# Patient Record
Sex: Female | Born: 1990
Health system: Southern US, Community
[De-identification: ages and names within clinical notes are randomized; demographics above are authoritative.]

## PROBLEM LIST (undated history)

## (undated) DIAGNOSIS — E739 Lactose intolerance, unspecified: Secondary | ICD-10-CM

## (undated) DIAGNOSIS — F329 Major depressive disorder, single episode, unspecified: Secondary | ICD-10-CM

## (undated) DIAGNOSIS — F419 Anxiety disorder, unspecified: Secondary | ICD-10-CM

## (undated) DIAGNOSIS — Z9109 Other allergy status, other than to drugs and biological substances: Secondary | ICD-10-CM

## (undated) DIAGNOSIS — R5383 Other fatigue: Secondary | ICD-10-CM

## (undated) DIAGNOSIS — E559 Vitamin D deficiency, unspecified: Secondary | ICD-10-CM

## (undated) DIAGNOSIS — F32A Depression, unspecified: Secondary | ICD-10-CM

## (undated) DIAGNOSIS — R7303 Prediabetes: Secondary | ICD-10-CM

## (undated) DIAGNOSIS — E282 Polycystic ovarian syndrome: Secondary | ICD-10-CM

## (undated) DIAGNOSIS — G43909 Migraine, unspecified, not intractable, without status migrainosus: Secondary | ICD-10-CM

## (undated) DIAGNOSIS — M549 Dorsalgia, unspecified: Secondary | ICD-10-CM

## (undated) HISTORY — DX: Polycystic ovarian syndrome: E28.2

## (undated) HISTORY — DX: Vitamin D deficiency, unspecified: E55.9

## (undated) HISTORY — PX: APPENDECTOMY: SHX54

## (undated) HISTORY — DX: Migraine, unspecified, not intractable, without status migrainosus: G43.909

## (undated) HISTORY — DX: Other fatigue: R53.83

## (undated) HISTORY — DX: Depression, unspecified: F32.A

## (undated) HISTORY — DX: Prediabetes: R73.03

## (undated) HISTORY — DX: Major depressive disorder, single episode, unspecified: F32.9

## (undated) HISTORY — DX: Anxiety disorder, unspecified: F41.9

## (undated) HISTORY — DX: Dorsalgia, unspecified: M54.9

## (undated) HISTORY — DX: Lactose intolerance, unspecified: E73.9

## (undated) HISTORY — PX: CYSTECTOMY: SUR359

## (undated) HISTORY — DX: Other allergy status, other than to drugs and biological substances: Z91.09

## (undated) HISTORY — PX: TONSILLECTOMY AND ADENOIDECTOMY: SUR1326

---
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
## 2012-01-09 ENCOUNTER — Encounter (HOSPITAL_COMMUNITY): Payer: Self-pay | Admitting: *Deleted

## 2012-01-09 ENCOUNTER — Emergency Department (HOSPITAL_COMMUNITY)
Admission: EM | Admit: 2012-01-09 | Discharge: 2012-01-10 | Disposition: A | Discharge status: Home / Self Care | Payer: BC Managed Care – PPO | Attending: Emergency Medicine | Admitting: Emergency Medicine

## 2012-01-09 DIAGNOSIS — I880 Nonspecific mesenteric lymphadenitis: Secondary | ICD-10-CM | POA: Insufficient documentation

## 2012-01-09 DIAGNOSIS — R1031 Right lower quadrant pain: Secondary | ICD-10-CM | POA: Insufficient documentation

## 2012-01-09 LAB — DIFFERENTIAL
Basophils Absolute: 0.1 10*3/uL (ref 0.0–0.1)
Basophils Relative: 1 % (ref 0–1)
Eosinophils Absolute: 0.1 10*3/uL (ref 0.0–0.7)
Eosinophils Relative: 1 % (ref 0–5)
Lymphocytes Relative: 36 % (ref 12–46)
Monocytes Absolute: 0.6 10*3/uL (ref 0.1–1.0)
Monocytes Relative: 4 % (ref 3–12)
Neutro Abs: 8.2 10*3/uL — ABNORMAL HIGH (ref 1.7–7.7)

## 2012-01-09 LAB — COMPREHENSIVE METABOLIC PANEL
Albumin: 4 g/dL (ref 3.5–5.2)
CO2: 26 mEq/L (ref 19–32)
GFR calc Af Amer: >90 mL/min (ref >90)

## 2012-01-09 LAB — CBC
HCT: 37.1 % (ref 36.0–46.0)
MCV: 67.7 fL — ABNORMAL LOW (ref 78.0–100.0)
Platelets: 410 10*3/uL — ABNORMAL HIGH (ref 150–400)
RBC: 5.48 MIL/uL — ABNORMAL HIGH (ref 3.87–5.11)
WBC: 14.1 10*3/uL — ABNORMAL HIGH (ref 4.0–10.5)

## 2012-01-09 NOTE — ED Notes (Signed)
Pt seen at [facility] for c/o abd pain; middle abd to right side; since Thursday; vomiting and nausea on and off since Thursday; told to come to ER due to elevated WBC count 13.5; denies any urinary symptoms

## 2012-01-10 ENCOUNTER — Emergency Department (HOSPITAL_COMMUNITY): Payer: BC Managed Care – PPO

## 2012-01-10 LAB — URINALYSIS, ROUTINE W REFLEX MICROSCOPIC
Bilirubin Urine: NEGATIVE
Glucose, UA: NEGATIVE mg/dL
Nitrite: NEGATIVE
Protein, ur: NEGATIVE mg/dL
Specific Gravity, Urine: 1.025 (ref 1.005–1.030)
Urobilinogen, UA: 0.2 mg/dL (ref 0.0–1.0)

## 2012-01-10 LAB — PREGNANCY, URINE: Preg Test, Ur: NEGATIVE

## 2012-01-10 MED ORDER — SODIUM CHLORIDE 0.9 % IV SOLN
Freq: Once | INTRAVENOUS | Status: AC
  Administered 2012-01-10: 01:00:00 via INTRAVENOUS

## 2012-01-10 MED ORDER — IBUPROFEN 600 MG PO TABS: 600.0000 mg | ORAL_TABLET | Freq: Four times a day (QID) | ORAL | Status: AC | PRN

## 2012-01-10 MED ORDER — HYDROMORPHONE HCL PF 1 MG/ML IJ SOLN
1.0000 mg | Freq: Once | INTRAMUSCULAR | Status: AC
  Administered 2012-01-10: 1 mg via INTRAVENOUS
  Filled 2012-01-10: qty 1

## 2012-01-10 MED ORDER — ONDANSETRON HCL 4 MG/2ML IJ SOLN
4.0000 mg | Freq: Once | INTRAMUSCULAR | Status: AC
  Administered 2012-01-10: 4 mg via INTRAVENOUS
  Filled 2012-01-10: qty 2

## 2012-01-10 MED ORDER — IOHEXOL 300 MG/ML  SOLN
100.0000 mL | Freq: Once | INTRAMUSCULAR | Status: AC | PRN
  Administered 2012-01-10: 100 mL via INTRAVENOUS

## 2012-01-10 NOTE — ED Provider Notes (Signed)
Medical screening examination/treatment/procedure(s) were performed by non-physician practitioner and as supervising physician I was immediately available for consultation/collaboration.  Beckam Abdulaziz M Suheily Birks, MD 01/10/12 0555 

## 2012-01-10 NOTE — Discharge Instructions (Signed)
Swollen Lymph Nodes The lymphatic system filters fluid from around cells. It is like a system of blood vessels. These channels carry lymph instead of blood. The lymphatic system is an important part of the immune (disease fighting) system. When people talk about "swollen glands in the neck," they are usually talking about swollen lymph nodes. The lymph nodes are like the little traps for infection. You and your caregiver may be able to feel lymph nodes, especially swollen nodes, in these common areas: the groin (inguinal area), armpits (axilla), and above the clavicle (supraclavicular). You may also feel them in the neck (cervical) and the back of the head just above the hairline (occipital). Swollen glands occur when there is any condition in which the body responds with an allergic type of reaction. For instance, the glands in the neck can become swollen from insect bites or any type of minor infection on the head. These are very noticeable in children with only minor problems. Lymph nodes may also become swollen when there is a tumor or problem with the lymphatic system, such as Hodgkin's disease. TREATMENT   Most swollen glands do not require treatment. They can be observed (watched) for a short period of time, if your caregiver feels it is necessary. Most of the time, observation is not necessary.   Antibiotics (medicines that kill germs) may be prescribed by your caregiver. Your caregiver may prescribe these if he or she feels the swollen glands are due to a bacterial (germ) infection. Antibiotics are not used if the swollen glands are caused by a virus.  HOME CARE INSTRUCTIONS   Take medications as directed by your caregiver. Only take over-the-counter or prescription medicines for pain, discomfort, or fever as directed by your caregiver.  SEEK MEDICAL CARE IF:   If you begin to run a temperature greater than 102 F (38.9 C), or as your caregiver suggests.  MAKE SURE YOU:   Understand these  instructions.   Will watch your condition.   Will get help right away if you are not doing well or get worse.  Document Released: 07/15/2002 Document Revised: 07/14/2011 Document Reviewed: 07/25/2005 [facility] Patient Information 2012 [place], [place].Your CT Scan shows only enlarged lymph nodes in your mesentery no infection Appendix is normal

## 2012-01-10 NOTE — ED Provider Notes (Signed)
History     CSN: [id number]  Arrival date & time 01/09/12  2032   First MD Initiated Contact with Patient 01/09/12 2359      Chief Complaint  Patient presents with  . Abdominal Pain    (Consider location/radiation/quality/duration/timing/severity/associated sxs/prior treatment) HPI Comments: Patient states that on Thursday, 5 days ago.  She noticed some vague discomfort in her abdomen she attributed this to onset of her menses.  She had one episode of vomiting.  She went down and rested.  Friday.  She felt vaguely uncomfortable still nauseated.  This again was repeated on Saturday, but by Sunday.  She had more to localized, right lower quadrant pain, anorexia, continued, nausea.  She went to urgent care today had lab work, which revealed a white count of 13.5.  No urinary tract infection, and was sent for further evaluation of the right lower cord and pain.  Patient is a 21 y.o. female presenting with abdominal pain. The history is provided by the patient.  Abdominal Pain The primary symptoms of the illness include abdominal pain and nausea. The primary symptoms of the illness do not include fever, fatigue, shortness of breath, vomiting, diarrhea, dysuria or vaginal discharge. The current episode started more than 2 days ago. The onset of the illness was gradual. The problem has been gradually worsening.  The abdominal pain began more than 2 days ago. The pain came on gradually. The abdominal pain has been gradually worsening since its onset. The abdominal pain is located in the RUQ. The abdominal pain does not radiate. The severity of the abdominal pain is 6/10. The abdominal pain is relieved by nothing. The abdominal pain is exacerbated by certain positions.  Nausea began 6 to 7 days ago.  The patient states that she believes she is currently not pregnant. The patient has not had a change in bowel habit. Additional symptoms associated with the illness include anorexia. Symptoms associated with  the illness do not include urgency, frequency or back pain.    History reviewed. No pertinent past medical history.  History reviewed. No pertinent past surgical history.  No family history on file.  History  Substance Use Topics  . Smoking status: Never Smoker   . Smokeless tobacco: Not on file  . Alcohol Use: No    OB History    Grav Para Term Preterm Abortions TAB SAB Ect Mult Living                  Review of Systems  Constitutional: Negative for fever and fatigue.  Respiratory: Negative for shortness of breath.   Gastrointestinal: Positive for nausea, abdominal pain and anorexia. Negative for vomiting and diarrhea.  Genitourinary: Negative for dysuria, urgency, frequency and vaginal discharge.  Musculoskeletal: Negative for back pain.  Neurological: Negative for weakness.    Allergies  Codeine and Zinc oxide  Home Medications   Current Outpatient Rx  Name Route Sig Dispense Refill  . IBUPROFEN 200 MG PO TABS Oral Take 200 mg by mouth every 6 (six) hours as needed. Pain    . IBUPROFEN 600 MG PO TABS Oral Take 1 tablet (600 mg total) by mouth every 6 (six) hours as needed for pain. 30 tablet 0    BP 110/68  Pulse 78  Temp(Src) 97.8 F (36.6 C) (Oral)  Resp 20  Ht 5\' 7"  (1.702 m)  Wt 215 lb (97.523 kg)  BMI 33.67 kg/m2  SpO2 96%  LMP 01/05/2012  Physical Exam  Constitutional: She appears well-developed and  well-nourished.  HENT:  Head: Normocephalic.  Eyes: Pupils are equal, round, and reactive to light.  Neck: Normal range of motion.  Cardiovascular: Normal rate.   Pulmonary/Chest: Effort normal.  Abdominal: She exhibits no distension. There is no hepatomegaly. There is tenderness in the right lower quadrant.      ED Course  Procedures (including critical care time)  Labs Reviewed  CBC - Abnormal; Notable for the following:    WBC 14.1 (*)    RBC 5.48 (*)    Hemoglobin 11.3 (*)    MCV 67.7 (*)    MCH 20.6 (*)    RDW 17.9 (*)    Platelets  410 (*)    All other components within normal limits  DIFFERENTIAL - Abnormal; Notable for the following:    Neutro Abs 8.2 (*)    Lymphs Abs 5.1 (*)    All other components within normal limits  COMPREHENSIVE METABOLIC PANEL - Abnormal; Notable for the following:    Total Bilirubin 0.2 (*)    All other components within normal limits  URINALYSIS, ROUTINE W REFLEX MICROSCOPIC  PREGNANCY, URINE   Ct Abdomen Pelvis W Contrast  01/10/2012  *RADIOLOGY REPORT*  Clinical Data: Right lower quadrant abdominal pain.  CT ABDOMEN AND PELVIS WITH CONTRAST  Technique:  Multidetector CT imaging of the abdomen and pelvis was performed following the standard protocol during bolus administration of intravenous contrast.  Contrast: OMNIPAQUE IOHEXOL 300 MG/ML  SOLN  Comparison: CT scan 06/25/2008.  Findings: The lung bases are clear.  No pleural effusion or pulmonary nodule.  There is diffuse and fairly marked fatty infiltration of the liver with an elongated left lobe surrounding the spleen.  Areas of focal fatty sparing are noted around the gallbladder.  No hepatic lesions or intrahepatic ductal dilatation.  The gallbladder is normal.  No common bile duct dilatation.  The pancreas is normal.  The spleen is normal in size.  No focal lesions.  The adrenal glands and kidneys are normal.  The stomach, duodenum, small bowel and colon are unremarkable.  No inflammatory changes or mass lesions.  The appendix is normal.  The uterus and ovaries are normal.  The bladder is normal.  No pelvic mass or adenopathy.  No free pelvic fluid collections.  The aorta is normal in caliber.  The major branch vessels are normal.  No mesenteric or retroperitoneal masses are lymphadenopathy.  There are scattered mesenteric lymph nodes.  The bony structures are unremarkable.  IMPRESSION: Unremarkable CT abdomen/pelvis.  Original Report Authenticated By: P. Loralie Champagne, M.D.     1. Lymphadenitis, mesenteric, acute       MDM    Exam and history concerning for acute appendicitis        Arman Filter, NP 01/10/12 0016  Arman Filter, NP 01/10/12 [phone]

## 2012-01-10 NOTE — ED Notes (Signed)
Pt plus family member was given a warm blanket

## 2012-01-10 NOTE — ED Notes (Signed)
Patient transported to CT 

## 2013-12-29 IMAGING — CT CT ABD-PELV W/ CM
1 of 2 series · 15 of 32 positions shown, 19 images · IV contrast (100 ML OMNI 300)
Comparison: CT scan 06/25/2008.

CLINICAL DATA: Right lower quadrant abdominal pain.

CT ABDOMEN AND PELVIS WITH CONTRAST
TECHNIQUE: Multidetector CT imaging of the abdomen and pelvis was
performed following the standard protocol during bolus
administration of intravenous contrast.
Contrast: 100mL OMNIPAQUE IOHEXOL 300 MG/ML  SOLN

[Series 2: abd/pel with · axial · 0.76mm/px · z∈[-406,+8]mm · 15 of 91 slices shown, 19 images]
[im 4/91  soft-tissue]
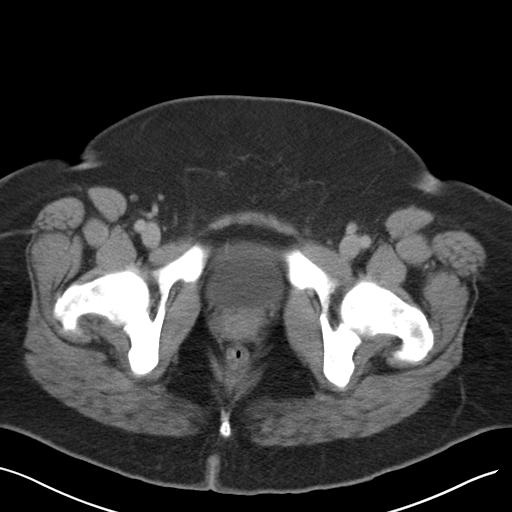
[im 4/91  bone]
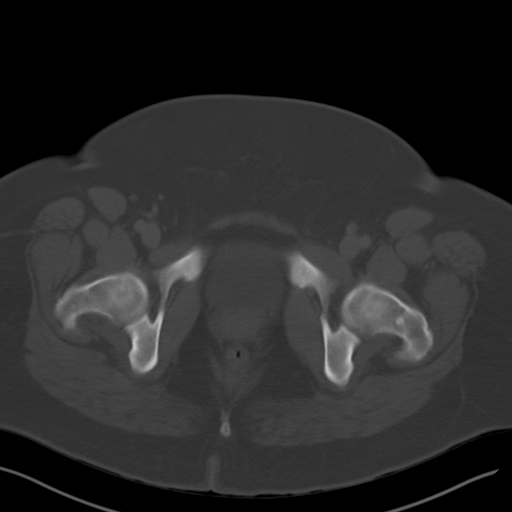
[im 12/91  soft-tissue]
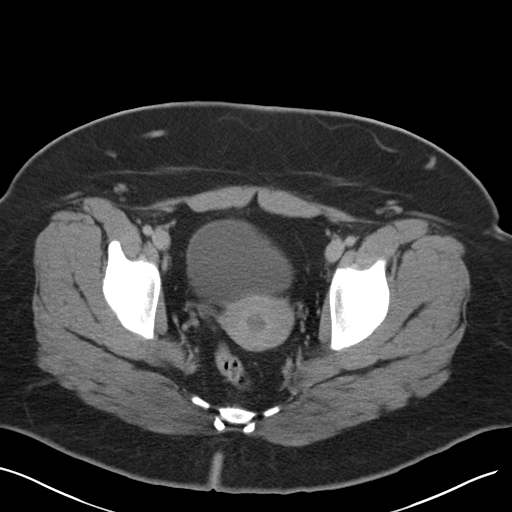
[im 19/91  soft-tissue]
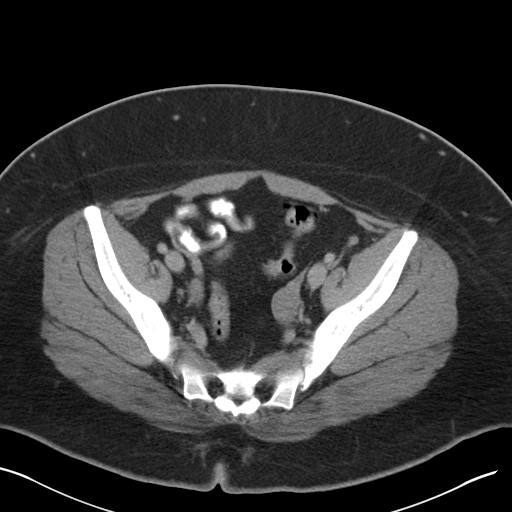
[im 27/91  soft-tissue]
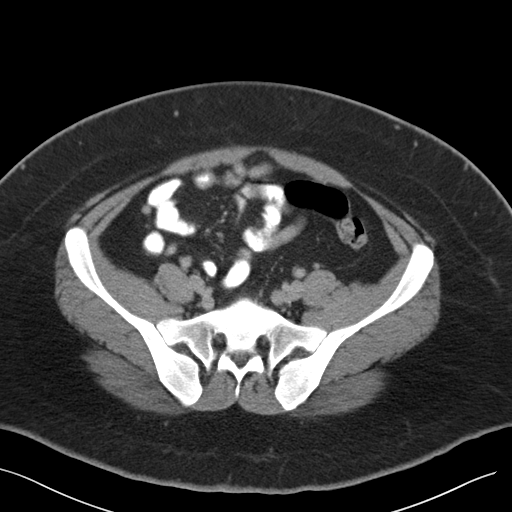
[im 31/91  soft-tissue]
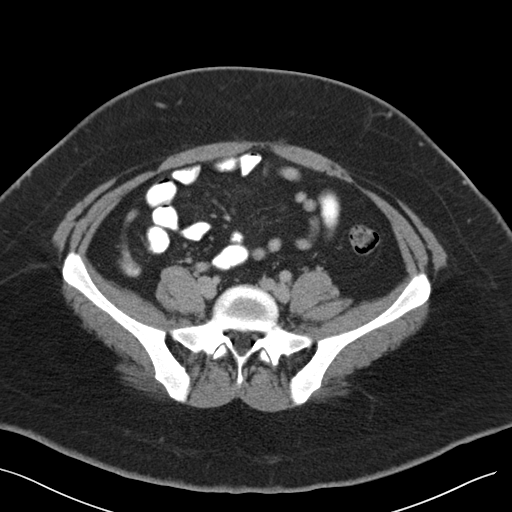
[im 38/91  soft-tissue]
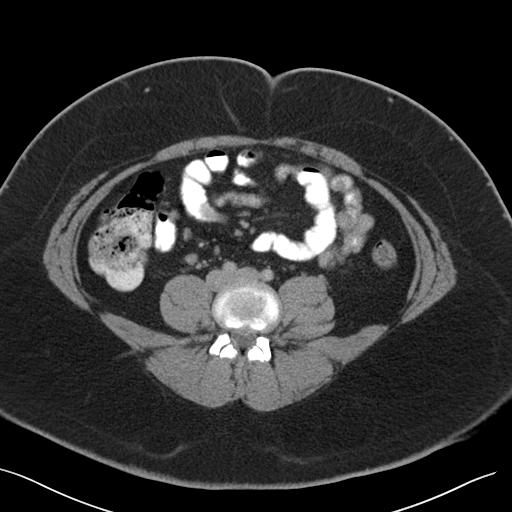
[im 46/91  soft-tissue]
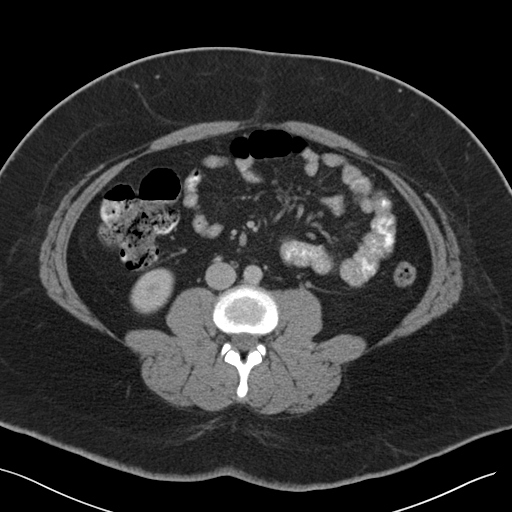
[im 53/91  soft-tissue]
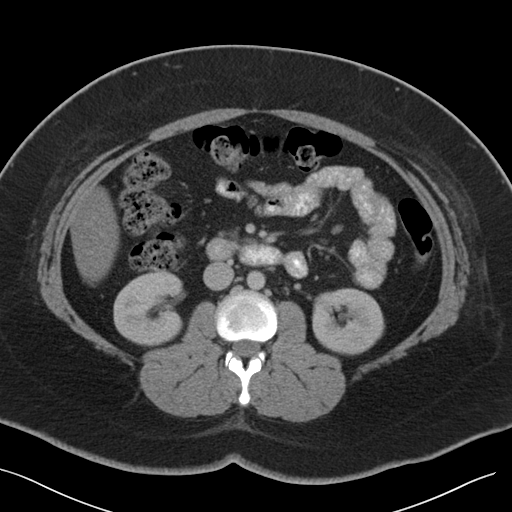
[im 61/91  soft-tissue]
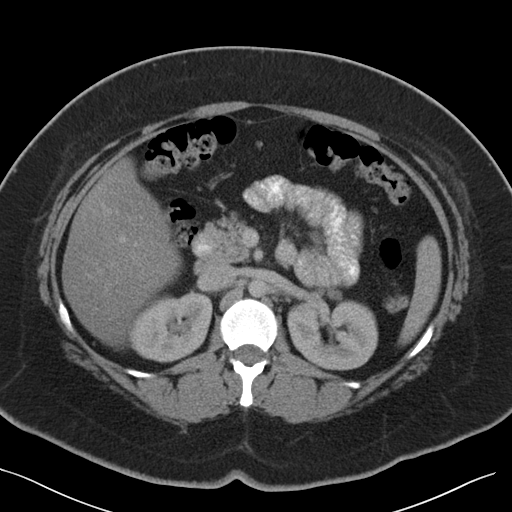
[im 61/91  bone]
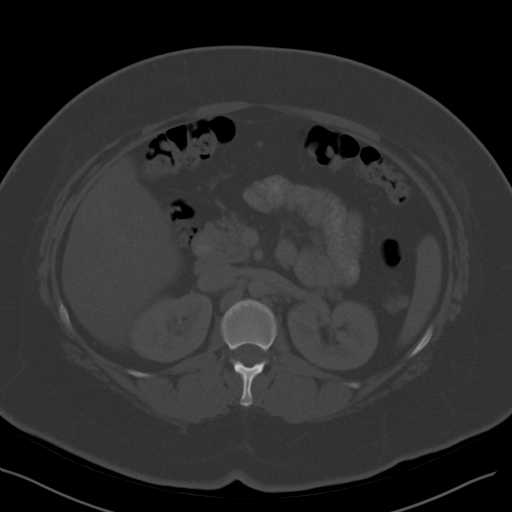
[im 64/91  soft-tissue]
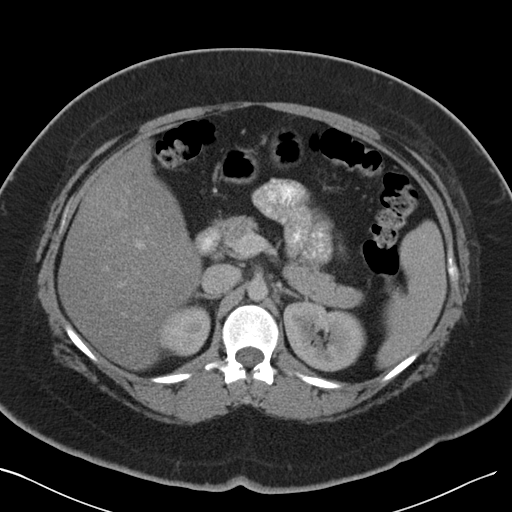
[im 72/91  soft-tissue]
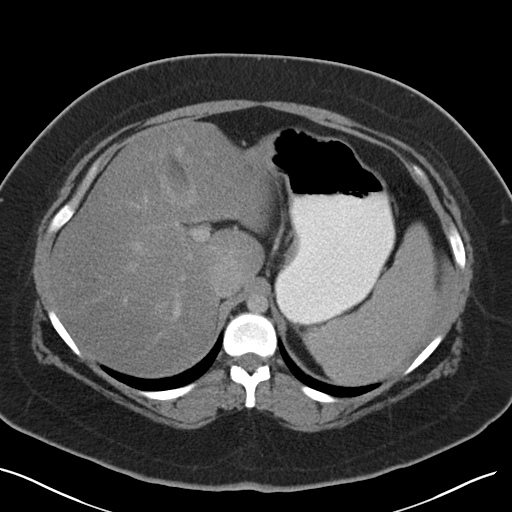
[im 76/91  lung]
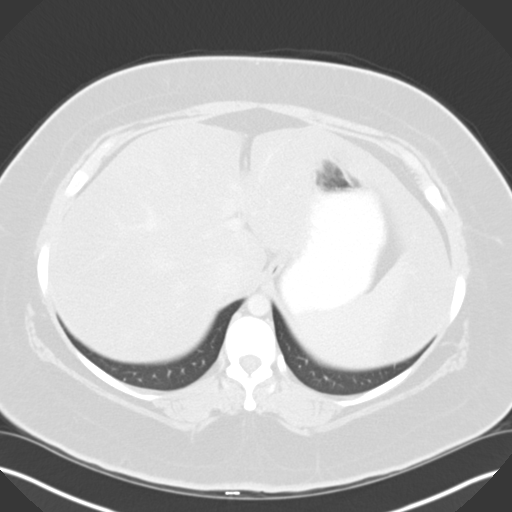
[im 79/91  soft-tissue]
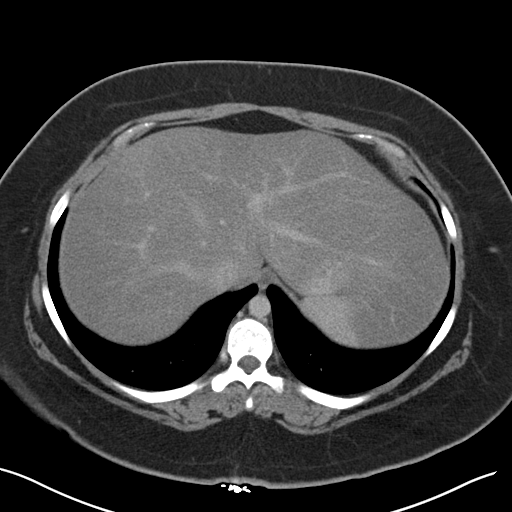
[im 79/91  lung]
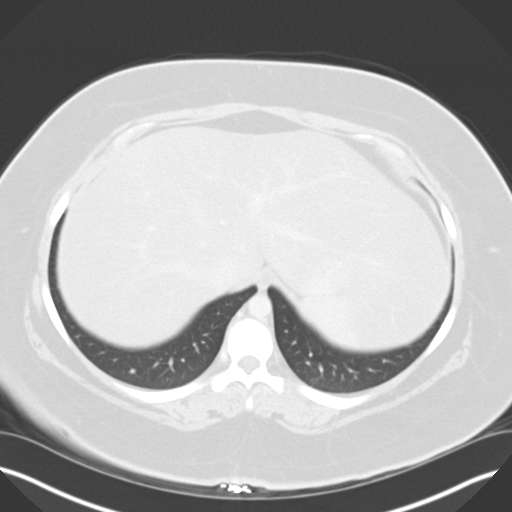
[im 83/91  lung]
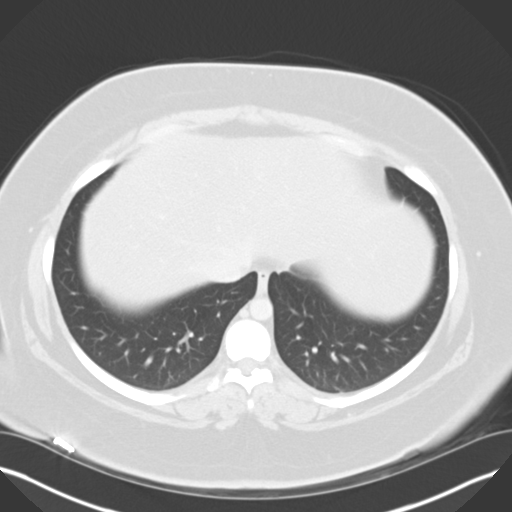
[im 87/91  soft-tissue]
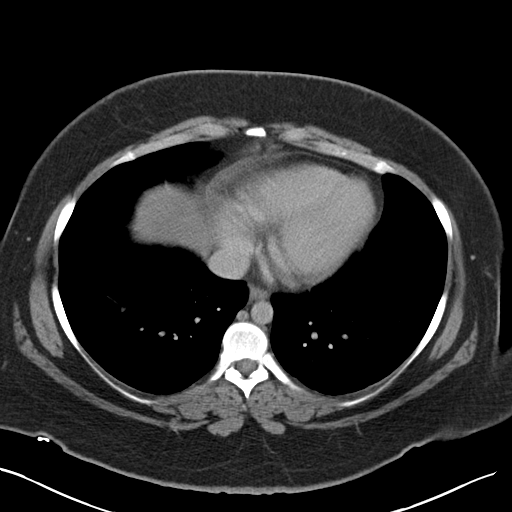
[im 87/91  lung]
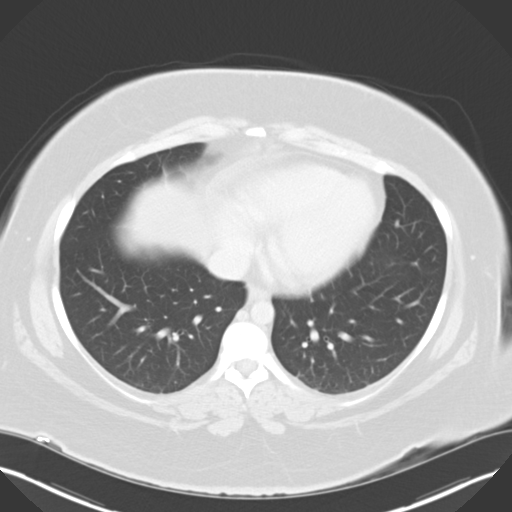

[15 of 32 positions shown; findings below may reference images not displayed]

FINDINGS: The lung bases are clear.  No pleural effusion or
pulmonary nodule.

There is diffuse and fairly marked fatty infiltration of the liver
with an elongated left lobe surrounding the spleen.  Areas of focal
fatty sparing are noted around the gallbladder.  No hepatic lesions
or intrahepatic ductal dilatation.  The gallbladder is normal.  No
common bile duct dilatation.  The pancreas is normal.  The spleen
is normal in size.  No focal lesions.  The adrenal glands and
kidneys are normal.

The stomach, duodenum, small bowel and colon are unremarkable.  No
inflammatory changes or mass lesions.  The appendix is normal.

The uterus and ovaries are normal.  The bladder is normal.  No
pelvic mass or adenopathy.  No free pelvic fluid collections.

The aorta is normal in caliber.  The major branch vessels are
normal.  No mesenteric or retroperitoneal masses are
lymphadenopathy.  There are scattered mesenteric lymph nodes.

The bony structures are unremarkable.
IMPRESSION: Unremarkable CT abdomen/pelvis.

## 2018-07-21 ENCOUNTER — Telehealth: Payer: Self-pay | Admitting: Nurse Practitioner

## 2018-07-21 DIAGNOSIS — R05 Cough: Secondary | ICD-10-CM

## 2018-07-21 DIAGNOSIS — R059 Cough, unspecified: Secondary | ICD-10-CM

## 2018-07-21 MED ORDER — BENZONATATE 100 MG PO CAPS: 100.0000 mg | ORAL_CAPSULE | Freq: Three times a day (TID) | ORAL | 0 refills | Status: DC | PRN

## 2018-07-21 NOTE — Progress Notes (Signed)
We are sorry that you are not feeling well.  Here is how we plan to help!  Based on your presentation I believe you most likely have A cough due to a virus.  This is called viral bronchitis and is best treated by rest, plenty of fluids and control of the cough. This is not the flu. You may use Ibuprofen or Tylenol as directed to help your symptoms.     In addition you may use A prescription cough medication called Tessalon Perles 100mg . You may take 1-2 capsules every 8 hours as needed for your cough.    From your responses in the eVisit questionnaire you describe inflammation in the upper respiratory tract which is causing a significant cough.  This is commonly called Bronchitis and has four common causes:    Allergies  Viral Infections  Acid Reflux  Bacterial Infection Allergies, viruses and acid reflux are treated by controlling symptoms or eliminating the cause. An example might be a cough caused by taking certain blood pressure medications. You stop the cough by changing the medication. Another example might be a cough caused by acid reflux. Controlling the reflux helps control the cough.  USE OF BRONCHODILATOR ("RESCUE") INHALERS: There is a risk from using your bronchodilator too frequently.  The risk is that over-reliance on a medication which only relaxes the muscles surrounding the breathing tubes can reduce the effectiveness of medications prescribed to reduce swelling and congestion of the tubes themselves.  Although you feel brief relief from the bronchodilator inhaler, your asthma may actually be worsening with the tubes becoming more swollen and filled with mucus.  This can delay other crucial treatments, such as oral steroid medications. If you need to use a bronchodilator inhaler daily, several times per day, you should discuss this with your provider.  There are probably better treatments that could be used to keep your asthma under control.     HOME CARE . Only take  medications as instructed by your medical team. . Complete the entire course of an antibiotic. . Drink plenty of fluids and get plenty of rest. . Avoid close contacts especially the very young and the elderly . Cover your mouth if you cough or cough into your sleeve. . Always remember to wash your hands . A steam or ultrasonic humidifier can help congestion.   GET HELP RIGHT AWAY IF: . You develop worsening fever. . You become short of breath . You cough up blood. . Your symptoms persist after you have completed your treatment plan MAKE SURE YOU   Understand these instructions.  Will watch your condition.  Will get help right away if you are not doing well or get worse.  Your e-visit answers were reviewed by a board certified advanced clinical practitioner to complete your personal care plan.  Depending on the condition, your plan could have included both over the counter or prescription medications. If there is a problem please reply  once you have received a response from your provider. Your safety is important to us.  If you have drug allergies check your prescription carefully.    You can use MyChart to ask questions about today's visit, request a non-urgent call back, or ask for a work or school excuse for 24 hours related to this e-Visit. If it has been greater than 24 hours you will need to follow up with your provider, or enter a new e-Visit to address those concerns. You will get an e-mail in the next two days asking  about your experience.  I hope that your e-visit has been valuable and will speed your recovery. Thank you for using e-visits.

## 2018-07-31 ENCOUNTER — Telehealth: Payer: Self-pay | Admitting: Nurse Practitioner

## 2018-07-31 DIAGNOSIS — B9789 Other viral agents as the cause of diseases classified elsewhere: Secondary | ICD-10-CM

## 2018-07-31 DIAGNOSIS — J329 Chronic sinusitis, unspecified: Secondary | ICD-10-CM

## 2018-07-31 MED ORDER — AMOXICILLIN-POT CLAVULANATE 875-125 MG PO TABS: 1.0000 | ORAL_TABLET | Freq: Two times a day (BID) | ORAL | 0 refills | Status: DC

## 2018-07-31 MED ORDER — FLUTICASONE PROPIONATE 50 MCG/ACT NA SUSP: 2.0000 | Freq: Every day | NASAL | 6 refills | Status: DC

## 2018-07-31 NOTE — Progress Notes (Signed)
Patient replied back and had actually been sick for >10 days. Did an evisit last week and was not given antibiotic. Was dx with viral cough. Decided to treat since had been greater then 10 days and was worsening.  Meds ordered this encounter  Medications  . fluticasone (FLONASE) 50 MCG/ACT nasal spray    Sig: Place 2 sprays into both nostrils daily.    Dispense:  16 g    Refill:  6    Order Specific Question:   Supervising Provider    Answer:   MILLER, BRIAN [[id number]]  . amoxicillin-clavulanate (AUGMENTIN) 875-125 MG tablet    Sig: Take 1 tablet by mouth 2 (two) times daily.    Dispense:  14 tablet    Refill:  0    Order Specific Question:   Supervising Provider    Answer:   Eber HongMILLER, BRIAN [[id number]]

## 2018-07-31 NOTE — Addendum Note (Signed)
Addended by: Bennie PieriniMARTIN, MARY-MARGARET on: 07/31/2018 07:30 PM   Modules accepted: Orders

## 2018-07-31 NOTE — Progress Notes (Signed)

## 2018-09-04 ENCOUNTER — Telehealth: Payer: Self-pay | Admitting: Physician Assistant

## 2018-09-04 DIAGNOSIS — R69 Illness, unspecified: Secondary | ICD-10-CM

## 2018-09-04 DIAGNOSIS — J111 Influenza due to unidentified influenza virus with other respiratory manifestations: Secondary | ICD-10-CM

## 2018-09-04 MED ORDER — OSELTAMIVIR PHOSPHATE 75 MG PO CAPS: 75.0000 mg | ORAL_CAPSULE | Freq: Two times a day (BID) | ORAL | 0 refills | Status: DC

## 2018-09-04 NOTE — Progress Notes (Signed)
E visit for Flu like symptoms   We are sorry that you are not feeling well.  Here is how we plan to help! Based on what you have shared with me it looks like you may have a respiratory virus that may be influenza.  Influenza or "the flu" is   an infection caused by a respiratory virus. The flu virus is highly contagious and persons who did not receive their yearly flu vaccination may "catch" the flu from close contact.  We have anti-viral medications to treat the viruses that cause this infection. They are not a "cure" and only shorten the course of the infection. These prescriptions are most effective when they are given within the first 2 days of "flu" symptoms. Antiviral medication are indicated if you have a high risk of complications from the flu. You should also consider an antiviral medication if you are in close contact with someone who is at risk. These medications can help patients avoid complications from the flu  but have side effects that you should know. Possible side effects from Tamiflu or oseltamivir include nausea, vomiting, diarrhea, dizziness, headaches, eye redness, sleep problems or other respiratory symptoms. You should not take Tamiflu if you have an allergy to oseltamivir or any to the ingredients in Tamiflu.  Based upon your symptoms and potential risk factors I have prescribed Oseltamivir (Tamiflu).  It has been sent to your designated pharmacy.  You will take one 75 mg capsule orally twice a day for the next 5 days.  ANYONE WHO HAS FLU SYMPTOMS SHOULD: Stay home. The flu is highly contagious and going out or to work exposes others!  Be sure to drink plenty of fluids. Water is fine as well as fruit juices, sodas and electrolyte beverages. You may want to stay away from caffeine or alcohol. If you are nauseated, try taking small sips of liquids. How do you know if you are getting enough fluid? Your urine should be a pale yellow or almost colorless. Get rest. Taking a steamy  shower or using a humidifier may help nasal congestion and ease sore throat pain. Using a saline nasal spray works much the same way. Cough drops, hard candies and sore throat lozenges may ease your cough. Line up a caregiver. Have someone check on you regularly.   GET HELP RIGHT AWAY IF: You cannot keep down liquids or your medications. You become short of breath Your fell like you are going to pass out or loose consciousness. Your symptoms persist after you have completed your treatment plan MAKE SURE YOU  Understand these instructions. Will watch your condition. Will get help right away if you are not doing well or get worse.  Your e-visit answers were reviewed by a board certified advanced clinical practitioner to complete your personal care plan.  Depending on the condition, your plan could have included both over the counter or prescription medications.  If there is a problem please reply once you have received a response from your provider.  Your safety is important to [name].  If you have drug allergies check your prescription carefully.    You can use MyChart to ask questions about today's visit, request a non-urgent call back, or ask for a work or school excuse for 24 hours related to this e-Visit. If it has been greater than 24 hours you will need to follow up with your provider, or enter a new e-Visit to address those concerns.  You will get an e-mail in the next two  days asking about your experience.  I hope that your e-visit has been valuable and will speed your recovery. Thank you for using e-visits.   ===View-only below this line===   ----- Message -----    From: Drucilla Schmidt    Sent: 09/04/2018 10:09 AM EST      To: E-Visit Mailing List Subject: E-Visit Submission: Flu Like Symptoms  E-Visit Submission: Flu Like Symptoms --------------------------------  Question: How long have you had flu like symptoms? Answer:   less than 48 hours  Question: Are you having  any body aches or pain such as Answer:   Muscle pain            Back pain  Question: Do you have a cough or sore throat? Answer:   I have both a cough and a sore throat  Question: Are you in close contact with anyone who has similar symptoms ? Answer:   Yes  Question: Do you have a fever? Answer:   I don't know  Question: Describe your sore throat: Answer:   Hurts to swallow. Very dry cough  Question: How long have you had a sore throat? Answer:   1 day  Question: Do you have any tenderness or swelling in your neck? Answer:   No  Question: Are you short of breath? Answer:   No  Question: Do you have constant pain in your chest or abdomen? Answer:   No  Question: Are you able to keep liquids down? Answer:   Yes  Question: Have you experienced a seizure or loss of consciousness? Answer:   No  Question: Do you have a headache? Answer:   Yes  Question: Is there a rash? Answer:   No  Question: Are you over the age of 59? Answer:   No  Question: Are you treated for any of the following conditions: Asthma, COPD, diabetes, renal failure on dialysis, AIDS, any neuromuscular disease that effects the clearing of secretions heart failure or heart disease? Answer:   No  Question: Have you received recent chemotherapy or are you taking a drug that may reduce your ability to fight infections for psoriasis, arthritis, or any other autoimmune condition? Answer:   No  Question: Do you have close contact with anyone who has been diagnosed with any of the conditions listed above? Answer:   No  Question: Are you pregnant? Answer:   I am confident that I am not pregnant  Question: Are you breastfeeding? Answer:   No  Question: Are your symptoms severe enough that you think or someone has told you that you need to see a physician urgently? Answer:   No  Question: Are you allergic to Zanamivir or Oseltamivir (Tamiflu)? Answer:   No  Question: Are there children in your family  or household that are under the age of 5? Answer:   No  Question: Please list your medication allergies that you may have ? (If 'none' , please list as 'none') Answer:   Zincoxidie  Question: Please list any additional comments  Answer:

## 2018-09-07 DIAGNOSIS — J309 Allergic rhinitis, unspecified: Secondary | ICD-10-CM | POA: Diagnosis not present

## 2018-09-07 DIAGNOSIS — J0140 Acute pansinusitis, unspecified: Secondary | ICD-10-CM | POA: Diagnosis not present

## 2018-09-07 DIAGNOSIS — Z872 Personal history of diseases of the skin and subcutaneous tissue: Secondary | ICD-10-CM | POA: Diagnosis not present

## 2018-09-07 DIAGNOSIS — Z6841 Body Mass Index (BMI) 40.0 and over, adult: Secondary | ICD-10-CM | POA: Diagnosis not present

## 2018-09-11 ENCOUNTER — Encounter (INDEPENDENT_AMBULATORY_CARE_PROVIDER_SITE_OTHER): Payer: Self-pay

## 2018-09-18 ENCOUNTER — Ambulatory Visit: Payer: Self-pay | Admitting: Family Medicine

## 2018-09-18 DIAGNOSIS — Z6841 Body Mass Index (BMI) 40.0 and over, adult: Secondary | ICD-10-CM | POA: Diagnosis not present

## 2018-09-18 DIAGNOSIS — Z0001 Encounter for general adult medical examination with abnormal findings: Secondary | ICD-10-CM | POA: Diagnosis not present

## 2018-09-18 DIAGNOSIS — R7303 Prediabetes: Secondary | ICD-10-CM | POA: Diagnosis not present

## 2018-09-18 DIAGNOSIS — Z0100 Encounter for examination of eyes and vision without abnormal findings: Secondary | ICD-10-CM | POA: Diagnosis not present

## 2018-09-18 DIAGNOSIS — E559 Vitamin D deficiency, unspecified: Secondary | ICD-10-CM | POA: Diagnosis not present

## 2018-09-18 DIAGNOSIS — J309 Allergic rhinitis, unspecified: Secondary | ICD-10-CM | POA: Diagnosis not present

## 2018-09-18 DIAGNOSIS — Z872 Personal history of diseases of the skin and subcutaneous tissue: Secondary | ICD-10-CM | POA: Diagnosis not present

## 2018-09-18 DIAGNOSIS — G4700 Insomnia, unspecified: Secondary | ICD-10-CM | POA: Diagnosis not present

## 2018-09-20 ENCOUNTER — Encounter (INDEPENDENT_AMBULATORY_CARE_PROVIDER_SITE_OTHER): Payer: Self-pay

## 2018-09-20 ENCOUNTER — Ambulatory Visit (INDEPENDENT_AMBULATORY_CARE_PROVIDER_SITE_OTHER): Payer: Self-pay | Admitting: Family Medicine

## 2018-09-25 DIAGNOSIS — J3089 Other allergic rhinitis: Secondary | ICD-10-CM | POA: Diagnosis not present

## 2018-09-25 DIAGNOSIS — R05 Cough: Secondary | ICD-10-CM | POA: Diagnosis not present

## 2018-09-25 DIAGNOSIS — J4520 Mild intermittent asthma, uncomplicated: Secondary | ICD-10-CM | POA: Diagnosis not present

## 2018-09-25 DIAGNOSIS — J301 Allergic rhinitis due to pollen: Secondary | ICD-10-CM | POA: Diagnosis not present

## 2018-09-25 DIAGNOSIS — J329 Chronic sinusitis, unspecified: Secondary | ICD-10-CM | POA: Diagnosis not present

## 2018-09-27 ENCOUNTER — Ambulatory Visit (INDEPENDENT_AMBULATORY_CARE_PROVIDER_SITE_OTHER): Payer: 59 | Admitting: Family Medicine

## 2018-09-27 ENCOUNTER — Encounter (INDEPENDENT_AMBULATORY_CARE_PROVIDER_SITE_OTHER): Payer: Self-pay | Admitting: Family Medicine

## 2018-09-27 VITALS — BP 130/85 | HR 86 | Temp 97.9°F | Ht 67.0 in | Wt 300.0 lb

## 2018-09-27 DIAGNOSIS — R7303 Prediabetes: Secondary | ICD-10-CM | POA: Diagnosis not present

## 2018-09-27 DIAGNOSIS — Z6841 Body Mass Index (BMI) 40.0 and over, adult: Secondary | ICD-10-CM

## 2018-09-27 DIAGNOSIS — Z1331 Encounter for screening for depression: Secondary | ICD-10-CM | POA: Diagnosis not present

## 2018-09-27 DIAGNOSIS — R5383 Other fatigue: Secondary | ICD-10-CM | POA: Diagnosis not present

## 2018-09-27 DIAGNOSIS — R0602 Shortness of breath: Secondary | ICD-10-CM | POA: Diagnosis not present

## 2018-09-27 DIAGNOSIS — Z9189 Other specified personal risk factors, not elsewhere classified: Secondary | ICD-10-CM

## 2018-09-27 DIAGNOSIS — Z0289 Encounter for other administrative examinations: Secondary | ICD-10-CM

## 2018-09-28 LAB — T3: T3, Total: 166 ng/dL (ref 71–180)

## 2018-09-28 LAB — FOLATE: Folate: 4.8 ng/mL (ref >3.0)

## 2018-09-28 LAB — TSH: TSH: 4.09 u[IU]/mL (ref 0.450–4.500)

## 2018-09-28 LAB — T4, FREE: Free T4: 1.05 ng/dL (ref 0.82–1.77)

## 2018-09-28 LAB — VITAMIN B12: Vitamin B-12: 490 pg/mL (ref 232–1245)

## 2018-09-30 NOTE — Progress Notes (Signed)
.  Office: [phone]  /  Fax: [phone]   HPI:   Chief Complaint: OBESITY  Tamara Diaz (MR# [id number]) is a 28 y.o. female who presents on 09/27/2018 for obesity evaluation and treatment. Current BMI is Body mass index is 46.99 kg/m.[name] has struggled with obesity for years and has been unsuccessful in either losing weight or maintaining long term weight loss. [name] attended our information session and states she is currently in the action stage of change and ready to dedicate time achieving and maintaining a healthier weight.  [name] heard about our clinic through her co-workers. She is lactose intolerant.   [name] states her family eats meals together she thinks her family will eat healthier with  her she struggles with family and or coworkers weight loss sabotage her desired weight loss is 150 lbs she has been heavy most of  her life she started gaining weight in college/grad school her heaviest weight ever was 305 lbs she has significant food cravings issues  she snacks frequently in the evenings she skips meals frequently she is trying to eat vegetarian she is trying to eat vegan she is frequently drinking liquids with calories she has problems with excessive hunger  she frequently eats larger portions than normal  she struggles with emotional eating    Fatigue [name] feels her energy is lower than it should be. This has worsened with weight gain and has not worsened recently. [name] admits to daytime somnolence and  admits to waking up still tired. Patient is at risk for obstructive sleep apnea. Patent has a history of symptoms of daytime fatigue. Patient generally gets 6 hours of sleep per night, and states they generally have nightime awakenings. Snoring is present. Apneic episodes are not present. Epworth Sleepiness Score is 7.  Dyspnea on exertion [name] notes increasing shortness of breath with exercising and seems to be worsening over time with  weight gain. She notes getting out of breath sooner with activity than she used to. This has not gotten worse recently. EKG-T wave abnormality in V1-V3, normal sinus rhythm at 87 BPM. [name] denies orthopnea.  Pre-Diabetes [name] has a diagnosis of pre-diabetes based on her elevated Hgb A1c and was informed this puts her at greater risk of developing diabetes. She was diagnosed with pre-diabetes last year and she notes carbohydrate cravings. She is not taking metformin currently and continues to work on diet and exercise to decrease risk of diabetes. She denies nausea or hypoglycemia.  At risk for diabetes [name] is at higher than average risk for developing diabetes due to her obesity and pre-diabetes. She currently denies polyuria or polydipsia.  Depression Screen [name]'s Food and Mood (modified PHQ-9) score was  Depression screen PHQ 2/9 09/27/2018  Decreased Interest 3  Down, Depressed, Hopeless 3  PHQ - 2 Score 6  Altered sleeping 3  Tired, decreased energy 2  Change in appetite 2  Feeling bad or failure about yourself  1  Trouble concentrating 3  Moving slowly or fidgety/restless 0  Suicidal thoughts 0  PHQ-9 Score 17  Difficult doing work/chores Not difficult at all    ASSESSMENT AND PLAN:  Other fatigue - Plan: EKG 12-Lead, Vitamin B12, Folate, T3, T4, free, TSH  Shortness of breath on exertion  Prediabetes  Depression screening  At risk for diabetes mellitus  Class 3 severe obesity with serious comorbidity and body mass index (BMI) of 45.0 to 49.9 in adult, unspecified obesity type (HCC)  PLAN:  Fatigue Eldred was informed that her  fatigue may be related to obesity, depression or many other causes. Labs will be ordered, and in the meanwhile [name] has agreed to work on diet, exercise and weight loss to help with fatigue. Proper sleep hygiene was discussed including the need for 7-8 hours of quality sleep each night. A sleep study was not ordered based on  symptoms and Epworth score.  Dyspnea on exertion Denaja's shortness of breath appears to be obesity related and exercise induced. She has agreed to work on weight loss and gradually increase exercise to treat her exercise induced shortness of breath. If [name] follows our instructions and loses weight without improvement of her shortness of breath, we will plan to refer to pulmonology. We will monitor this condition regularly. [name] agrees to this plan.  Pre-Diabetes [name] will continue to work on weight loss, exercise, and decreasing simple carbohydrates in her diet to help decrease the risk of diabetes. We dicussed metformin including benefits and risks. She was informed that eating too many simple carbohydrates or too many calories at one sitting increases the likelihood of GI side effects. [name] declined metformin for now and a prescription was not written today. We will check insulin today. [name] agrees to follow up with our clinic in 2 weeks as directed to monitor her progress.  Diabetes risk counseling [name] was given extended (15 minutes) diabetes prevention counseling today. She is 28 y.o. female and has risk factors for diabetes including obesity and pre-diabetes. We discussed intensive lifestyle modifications today with an emphasis on weight loss as well as increasing exercise and decreasing simple carbohydrates in her diet.  Depression Screen [name] had a strongly positive depression screening. Depression is commonly associated with obesity and often results in emotional eating behaviors. We will monitor this closely and work on CBT to help improve the non-hunger eating patterns. Referral to Psychology may be required if no improvement is seen as she continues in our clinic.  Obesity [name] is currently in the action stage of change and her goal is to continue with weight loss efforts She has agreed to follow the Category 3 plan  [name] is lactose intolerant-changed greek  yogurt to 2 oz of lean meat, cheese every other day; and at least 6 oz of meat at dinner. [name] has been instructed to work up to a goal of 150 minutes of combined cardio and strengthening exercise per week for weight loss and overall health benefits. We discussed the following Behavioral Modification Strategies today: increasing lean protein intake, increasing vegetables and work on meal planning and easy cooking plans, and planning for success  Cinzia has agreed to follow up with our clinic in 2 weeks. She was informed of the importance of frequent follow up visits to maximize her success with intensive lifestyle modifications for her multiple health conditions. She was informed we would discuss her lab results at her next visit unless there is a critical issue that needs to be addressed sooner. Terrika agreed to keep her next visit at the agreed upon time to discuss these results.  ALLERGIES: Allergies  Allergen Reactions  . Codeine     insomnia   . Lactose Intolerance (Gi) Diarrhea  . Zinc Oxide Rash    MEDICATIONS: Current Outpatient Medications on File Prior to Visit  Medication Sig Dispense Refill  . acetaminophen (TYLENOL) 325 MG tablet Take 650 mg by mouth every 6 (six) hours as needed.    . cetirizine (ZYRTEC) 10 MG tablet Take 10 mg by mouth daily.    Marland Kitchen  fluticasone (FLONASE) 50 MCG/ACT nasal spray Place 2 sprays into both nostrils daily. 16 g 6  . Melatonin 5 MG TABS Take 1 tablet by mouth at bedtime.    . montelukast (SINGULAIR) 10 MG tablet Take 10 mg by mouth at bedtime.     No current facility-administered medications on file prior to visit.     PAST MEDICAL HISTORY: Past Medical History:  Diagnosis Date  . Anxiety   . Back pain   . Depression   . Environmental allergies   . Fatigue   . Lactose intolerance   . Migraines   . Prediabetes   . Vitamin D deficiency     PAST SURGICAL HISTORY: Past Surgical History:  Procedure Laterality Date  . APPENDECTOMY      . CYSTECTOMY    . TONSILLECTOMY AND ADENOIDECTOMY      SOCIAL HISTORY: Social History   Tobacco Use  . Smoking status: Never Smoker  . Smokeless tobacco: Never Used  Substance Use Topics  . Alcohol use: Yes    Comment: socially  . Drug use: Not on file    FAMILY HISTORY: Family History  Problem Relation Age of Onset  . Diabetes Father   . Hypertension Father   . Heart disease Father   . Depression Father   . Anxiety disorder Father   . Bipolar disorder Father   . Obesity Father     ROS: Review of Systems  Constitutional: Positive for malaise/fatigue. Negative for weight loss.       + Trouble sleeping  HENT: Positive for ear pain and sinus pain.   Eyes:       + Wear glasses or contacts  Respiratory: Positive for cough and shortness of breath (with exertion).   Cardiovascular: Negative for orthopnea.  Gastrointestinal: Negative for nausea.  Genitourinary: Negative for frequency.  Skin:       + Dryness  Neurological: Positive for headaches.  Endo/Heme/Allergies: Negative for polydipsia.       Negative hypoglycemia  Psychiatric/Behavioral:       + Stress    PHYSICAL EXAM: Blood pressure 130/85, pulse 86, temperature 97.9 F (36.6 C), temperature source Oral, height 5\' 7"  (1.702 m), weight 300 lb (136.1 kg), last menstrual period 09/05/2018, SpO2 95 %. Body mass index is 46.99 kg/m. Physical Exam Vitals signs reviewed.  Constitutional:      Appearance: Normal appearance. She is obese.  HENT:     Head: Normocephalic and atraumatic.     Nose: Nose normal.  Eyes:     General: No scleral icterus.    Extraocular Movements: Extraocular movements intact.  Neck:     Musculoskeletal: Normal range of motion and neck supple.     Comments: No thyromegaly present Cardiovascular:     Rate and Rhythm: Normal rate and regular rhythm.     Pulses: Normal pulses.     Heart sounds: Normal heart sounds.  Pulmonary:     Effort: Pulmonary effort is normal. No respiratory  distress.     Breath sounds: Normal breath sounds.  Abdominal:     Palpations: Abdomen is soft.     Tenderness: There is no abdominal tenderness.     Comments: + Obesity  Musculoskeletal: Normal range of motion.     Right lower leg: No edema.     Left lower leg: No edema.  Skin:    General: Skin is warm and dry.  Neurological:     Mental Status: She is alert and oriented to person, place, and  time.     Coordination: Coordination normal.  Psychiatric:        Mood and Affect: Mood normal.        Behavior: Behavior normal.     RECENT LABS AND TESTS: BMET    Component Value Date/Time   NA 139 01/09/2012 2210   K 3.9 01/09/2012 2210   CL 103 01/09/2012 2210   CO2 26 01/09/2012 2210   GLUCOSE 88 01/09/2012 2210   BUN 11 01/09/2012 2210   CREATININE 0.66 01/09/2012 2210   CALCIUM 9.7 01/09/2012 2210   GFRNONAA >90 01/09/2012 2210   GFRAA >90 01/09/2012 2210   No results found for: HGBA1C No results found for: INSULIN CBC    Component Value Date/Time   WBC 14.1 (H) 01/09/2012 2210   RBC 5.48 (H) 01/09/2012 2210   HGB 11.3 (L) 01/09/2012 2210   HCT 37.1 01/09/2012 2210   PLT 410 (H) 01/09/2012 2210   MCV 67.7 (L) 01/09/2012 2210   MCH 20.6 (L) 01/09/2012 2210   MCHC 30.5 01/09/2012 2210   RDW 17.9 (H) 01/09/2012 2210   LYMPHSABS 5.1 (H) 01/09/2012 2210   MONOABS 0.6 01/09/2012 2210   EOSABS 0.1 01/09/2012 2210   BASOSABS 0.1 01/09/2012 2210   Iron/TIBC/Ferritin/ %Sat No results found for: IRON, TIBC, FERRITIN, IRONPCTSAT Lipid Panel  No results found for: CHOL, TRIG, HDL, CHOLHDL, VLDL, LDLCALC, LDLDIRECT Hepatic Function Panel     Component Value Date/Time   PROT 7.8 01/09/2012 2210   ALBUMIN 4.0 01/09/2012 2210   AST 23 01/09/2012 2210   ALT 31 01/09/2012 2210   ALKPHOS 55 01/09/2012 2210   BILITOT 0.2 (L) 01/09/2012 2210      Component Value Date/Time   TSH 4.090 09/27/2018 1153    ECG  shows NSR with a rate of 87 BPM INDIRECT CALORIMETER done today  shows a VO2 of 370 and a REE of 2576. Her calculated basal metabolic rate is 3709 thus her basal metabolic rate is better than expected.       OBESITY BEHAVIORAL INTERVENTION VISIT  Today's visit was # 1   Starting weight: 300 lbs Starting date: 09/27/2018 Today's weight : 300 lbs Today's date: 09/27/2018 Total lbs lost to date: 0    ASK: We discussed the diagnosis of obesity with Luther Parody Parmer today and Abbiegail agreed to give [name] permission to discuss obesity behavioral modification therapy today.  ASSESS: Anoop has the diagnosis of obesity and her BMI today is 46.98 Clarabell is in the action stage of change   ADVISE: Alexiz was educated on the multiple health risks of obesity as well as the benefit of weight loss to improve her health. She was advised of the need for long term treatment and the importance of lifestyle modifications to improve her current health and to decrease her risk of future health problems.  AGREE: Multiple dietary modification options and treatment options were discussed and  Idaly agreed to follow the recommendations documented in the above note.  ARRANGE: Babygirl was educated on the importance of frequent visits to treat obesity as outlined per CMS and USPSTF guidelines and agreed to schedule her next follow up appointment today.   I, Burt Knack, am acting as transcriptionist for Debbra Riding, MD   I have reviewed the above documentation for accuracy and completeness, and I agree with the above. - Debbra Riding, MD

## 2018-10-04 ENCOUNTER — Ambulatory Visit (INDEPENDENT_AMBULATORY_CARE_PROVIDER_SITE_OTHER): Payer: Self-pay | Admitting: Family Medicine

## 2018-10-07 DIAGNOSIS — J301 Allergic rhinitis due to pollen: Secondary | ICD-10-CM | POA: Diagnosis not present

## 2018-10-07 DIAGNOSIS — J3089 Other allergic rhinitis: Secondary | ICD-10-CM | POA: Diagnosis not present

## 2018-10-08 ENCOUNTER — Ambulatory Visit (INDEPENDENT_AMBULATORY_CARE_PROVIDER_SITE_OTHER): Payer: Self-pay | Admitting: Family Medicine

## 2018-10-11 ENCOUNTER — Ambulatory Visit (INDEPENDENT_AMBULATORY_CARE_PROVIDER_SITE_OTHER): Payer: 59 | Admitting: Family Medicine

## 2018-10-11 ENCOUNTER — Encounter (INDEPENDENT_AMBULATORY_CARE_PROVIDER_SITE_OTHER): Payer: Self-pay | Admitting: Family Medicine

## 2018-10-11 VITALS — BP 136/81 | HR 98 | Temp 98.2°F | Ht 67.0 in | Wt 299.0 lb

## 2018-10-11 DIAGNOSIS — Z9189 Other specified personal risk factors, not elsewhere classified: Secondary | ICD-10-CM

## 2018-10-11 DIAGNOSIS — Z6841 Body Mass Index (BMI) 40.0 and over, adult: Secondary | ICD-10-CM | POA: Diagnosis not present

## 2018-10-11 DIAGNOSIS — R7303 Prediabetes: Secondary | ICD-10-CM | POA: Diagnosis not present

## 2018-10-11 DIAGNOSIS — E559 Vitamin D deficiency, unspecified: Secondary | ICD-10-CM | POA: Diagnosis not present

## 2018-10-11 MED ORDER — VITAMIN D (ERGOCALCIFEROL) 1.25 MG (50000 UNIT) PO CAPS: 50000.0000 [IU] | ORAL_CAPSULE | ORAL | 0 refills | Status: DC

## 2018-10-13 NOTE — Progress Notes (Signed)
Office: [phone]  /  Fax: [phone]   HPI:   Chief Complaint: OBESITY Tamara Diaz is here to discuss her progress with her obesity treatment plan. She is on the Category 3 plan and is following her eating plan approximately 70 % of the time. She states she is exercising 0 minutes 0 times per week. Tamara Diaz thought it was a good amount of food and had trouble finishing all the food. She notes it was typical foods for her. She is getting in 6 to 8 oz of meat at dinner. She denies hunger and was getting in the full 300 calories for snacks.  Her weight is 299 lb (135.6 kg) today and has had a weight loss of 1 pound over a period of 2 weeks since her last visit. She has lost 1 lb since starting treatment with [name].  Vitamin D Deficiency Tamara Diaz has a diagnosis of vitamin D deficiency. She is currently taking OTC Vit D 10,000 IU. She notes fatigue and denies nausea, vomiting or muscle weakness.  At risk for osteopenia and osteoporosis Tamara Diaz is at higher risk of osteopenia and osteoporosis due to vitamin D deficiency.   Pre-Diabetes Tamara Diaz has a diagnosis of pre-diabetes based on her previously elevated Hgb A1c on recent labs and was informed this puts her at greater risk of developing diabetes. She is not on medications and continues to work on diet and exercise to decrease risk of diabetes. She denies nausea or hypoglycemia.  ASSESSMENT AND PLAN:  Vitamin D deficiency - Plan: Vitamin D, Ergocalciferol, (DRISDOL) 1.25 MG (50000 UT) CAPS capsule  Prediabetes  At risk for osteoporosis  Class 3 severe obesity with serious comorbidity and body mass index (BMI) of 45.0 to 49.9 in adult, unspecified obesity type (HCC)  PLAN:  Vitamin D Deficiency Tamara Diaz was informed that low vitamin D levels contributes to fatigue and are associated with obesity, breast, and colon cancer. Tamara Diaz agrees to start prescription Vit D @50 ,000 IU every week #4 with no refills. She will follow up for routine  testing of vitamin D, at least 2-3 times per year. She was informed of the risk of over-replacement of vitamin D and agrees to not increase her dose unless she discusses this with [name] first. Tamara Diaz agrees to follow up with our clinic in 2 weeks.  At risk for osteopenia and osteoporosis Tamara Diaz was given extended (30 minutes) osteoporosis prevention counseling today. Tamara Diaz is at risk for osteopenia and osteoporsis due to her vitamin D deficiency. She was encouraged to take her vitamin D and follow her higher calcium diet and increase strengthening exercise to help strengthen her bones and decrease her risk of osteopenia and osteoporosis.  Pre-Diabetes Tamara Diaz will continue to work on weight loss, exercise, and decreasing simple carbohydrates in her diet to help decrease the risk of diabetes. We dicussed metformin including benefits and risks. She was informed that eating too many simple carbohydrates or too many calories at one sitting increases the likelihood of GI side effects. Tamara Diaz declined metformin for now and a prescription was not written today. We will follow up in 3 months (call her primary care physician office for labs). Tamara Diaz agrees to follow up with our clinic in 2 weeks as directed to monitor her progress.  Obesity Tamara Diaz is currently in the action stage of change. As such, her goal is to continue with weight loss efforts She has agreed to follow the Category 3 plan Tamara Diaz has been instructed to work up to a goal of 150 minutes  of combined cardio and strengthening exercise per week for weight loss and overall health benefits. We discussed the following Behavioral Modification Strategies today: increasing lean protein intake, increasing vegetables, work on meal planning and easy cooking plans, and planning for success   Tamara Diaz has agreed to follow up with our clinic in 2 weeks. She was informed of the importance of frequent follow up visits to maximize her success with  intensive lifestyle modifications for her multiple health conditions.  ALLERGIES: Allergies  Allergen Reactions  . Codeine     insomnia   . Lactose Intolerance (Gi) Diarrhea  . Zinc Oxide Rash    MEDICATIONS: Current Outpatient Medications on File Prior to Visit  Medication Sig Dispense Refill  . acetaminophen (TYLENOL) 325 MG tablet Take 650 mg by mouth every 6 (six) hours as needed.    . cetirizine (ZYRTEC) 10 MG tablet Take 10 mg by mouth daily.    . fluticasone (FLONASE) 50 MCG/ACT nasal spray Place 2 sprays into both nostrils daily. 16 g 6  . Melatonin 5 MG TABS Take 1 tablet by mouth at bedtime.    . montelukast (SINGULAIR) 10 MG tablet Take 10 mg by mouth at bedtime.     No current facility-administered medications on file prior to visit.     PAST MEDICAL HISTORY: Past Medical History:  Diagnosis Date  . Anxiety   . Back pain   . Depression   . Environmental allergies   . Fatigue   . Lactose intolerance   . Migraines   . Prediabetes   . Vitamin D deficiency     PAST SURGICAL HISTORY: Past Surgical History:  Procedure Laterality Date  . APPENDECTOMY    . CYSTECTOMY    . TONSILLECTOMY AND ADENOIDECTOMY      SOCIAL HISTORY: Social History   Tobacco Use  . Smoking status: Never Smoker  . Smokeless tobacco: Never Used  Substance Use Topics  . Alcohol use: Yes    Comment: socially  . Drug use: Not on file    FAMILY HISTORY: Family History  Problem Relation Age of Onset  . Diabetes Father   . Hypertension Father   . Heart disease Father   . Depression Father   . Anxiety disorder Father   . Bipolar disorder Father   . Obesity Father     ROS: Review of Systems  Constitutional: Positive for malaise/fatigue and weight loss.  Gastrointestinal: Negative for nausea and vomiting.  Musculoskeletal:       Negative muscle weakness  Endo/Heme/Allergies:       Negative hypoglycemia    PHYSICAL EXAM: Blood pressure 136/81, pulse 98, temperature 98.2  F (36.8 C), temperature source Oral, height 5\' 7"  (1.702 m), weight 299 lb (135.6 kg), SpO2 95 %. Body mass index is 46.83 kg/m. Physical Exam Vitals signs reviewed.  Constitutional:      Appearance: Normal appearance. She is obese.  Cardiovascular:     Rate and Rhythm: Normal rate.     Pulses: Normal pulses.  Pulmonary:     Effort: Pulmonary effort is normal.     Breath sounds: Normal breath sounds.  Musculoskeletal: Normal range of motion.  Skin:    General: Skin is warm and dry.  Neurological:     Mental Status: She is alert and oriented to person, place, and time.  Psychiatric:        Mood and Affect: Mood normal.        Behavior: Behavior normal.     RECENT LABS AND  TESTS: BMET    Component Value Date/Time   NA 139 01/09/2012 2210   K 3.9 01/09/2012 2210   CL 103 01/09/2012 2210   CO2 26 01/09/2012 2210   GLUCOSE 88 01/09/2012 2210   BUN 11 01/09/2012 2210   CREATININE 0.66 01/09/2012 2210   CALCIUM 9.7 01/09/2012 2210   GFRNONAA >90 01/09/2012 2210   GFRAA >90 01/09/2012 2210   No results found for: HGBA1C No results found for: INSULIN CBC    Component Value Date/Time   WBC 14.1 (H) 01/09/2012 2210   RBC 5.48 (H) 01/09/2012 2210   HGB 11.3 (L) 01/09/2012 2210   HCT 37.1 01/09/2012 2210   PLT 410 (H) 01/09/2012 2210   MCV 67.7 (L) 01/09/2012 2210   MCH 20.6 (L) 01/09/2012 2210   MCHC 30.5 01/09/2012 2210   RDW 17.9 (H) 01/09/2012 2210   LYMPHSABS 5.1 (H) 01/09/2012 2210   MONOABS 0.6 01/09/2012 2210   EOSABS 0.1 01/09/2012 2210   BASOSABS 0.1 01/09/2012 2210   Iron/TIBC/Ferritin/ %Sat No results found for: IRON, TIBC, FERRITIN, IRONPCTSAT Lipid Panel  No results found for: CHOL, TRIG, HDL, CHOLHDL, VLDL, LDLCALC, LDLDIRECT Hepatic Function Panel     Component Value Date/Time   PROT 7.8 01/09/2012 2210   ALBUMIN 4.0 01/09/2012 2210   AST 23 01/09/2012 2210   ALT 31 01/09/2012 2210   ALKPHOS 55 01/09/2012 2210   BILITOT 0.2 (L) 01/09/2012  2210      Component Value Date/Time   TSH 4.090 09/27/2018 1153      OBESITY BEHAVIORAL INTERVENTION VISIT  Today's visit was # 2   Starting weight: 300 lbs Starting date: 09/27/2018 Today's weight : 299 lbs  Today's date: 10/11/2018 Total lbs lost to date: 1    10/11/2018  Height  (1.702 m)  Weight 299 lb (135.6 kg)  BMI (Calculated) 46.82  BLOOD PRESSURE - SYSTOLIC 136  BLOOD PRESSURE - DIASTOLIC 81   Body Fat % 48.5 %  Total Body Water (lbs) 100 lbs     ASK: We discussed the diagnosis of obesity with Tamara Diaz today and Tamara Diaz agreed to give [name] permission to discuss obesity behavioral modification therapy today.  ASSESS: Tamara Diaz has the diagnosis of obesity and her BMI today is 46.82 Tamara Diaz is in the action stage of change   ADVISE: Tamara Diaz was educated on the multiple health risks of obesity as well as the benefit of weight loss to improve her health. She was advised of the need for long term treatment and the importance of lifestyle modifications to improve her current health and to decrease her risk of future health problems.  AGREE: Multiple dietary modification options and treatment options were discussed and  Tamara Diaz agreed to follow the recommendations documented in the above note.  ARRANGE: Tamara Diaz was educated on the importance of frequent visits to treat obesity as outlined per CMS and USPSTF guidelines and agreed to schedule her next follow up appointment today.  I, Burt Knack, am acting as transcriptionist for Debbra Riding, MD  I have reviewed the above documentation for accuracy and completeness, and I agree with the above. - Debbra Riding, MD

## 2018-10-16 ENCOUNTER — Ambulatory Visit (INDEPENDENT_AMBULATORY_CARE_PROVIDER_SITE_OTHER): Payer: Self-pay | Admitting: Bariatrics

## 2018-10-18 DIAGNOSIS — J3089 Other allergic rhinitis: Secondary | ICD-10-CM | POA: Diagnosis not present

## 2018-10-18 DIAGNOSIS — J301 Allergic rhinitis due to pollen: Secondary | ICD-10-CM | POA: Diagnosis not present

## 2018-10-24 ENCOUNTER — Ambulatory Visit: Payer: Self-pay | Admitting: Women's Health

## 2018-10-30 DIAGNOSIS — J301 Allergic rhinitis due to pollen: Secondary | ICD-10-CM | POA: Diagnosis not present

## 2018-10-30 DIAGNOSIS — J3089 Other allergic rhinitis: Secondary | ICD-10-CM | POA: Diagnosis not present

## 2018-10-31 ENCOUNTER — Encounter (INDEPENDENT_AMBULATORY_CARE_PROVIDER_SITE_OTHER): Payer: Self-pay

## 2018-10-31 ENCOUNTER — Ambulatory Visit (INDEPENDENT_AMBULATORY_CARE_PROVIDER_SITE_OTHER): Payer: Self-pay | Admitting: Bariatrics

## 2018-11-01 ENCOUNTER — Encounter (INDEPENDENT_AMBULATORY_CARE_PROVIDER_SITE_OTHER): Payer: Self-pay

## 2018-11-01 ENCOUNTER — Ambulatory Visit (INDEPENDENT_AMBULATORY_CARE_PROVIDER_SITE_OTHER): Payer: 59 | Admitting: Family Medicine

## 2018-11-01 ENCOUNTER — Other Ambulatory Visit: Payer: Self-pay

## 2018-11-01 ENCOUNTER — Encounter (INDEPENDENT_AMBULATORY_CARE_PROVIDER_SITE_OTHER): Payer: Self-pay | Admitting: Family Medicine

## 2018-11-01 DIAGNOSIS — R7303 Prediabetes: Secondary | ICD-10-CM

## 2018-11-01 DIAGNOSIS — E66813 Obesity, class 3: Secondary | ICD-10-CM

## 2018-11-01 DIAGNOSIS — E559 Vitamin D deficiency, unspecified: Secondary | ICD-10-CM | POA: Diagnosis not present

## 2018-11-01 DIAGNOSIS — Z6841 Body Mass Index (BMI) 40.0 and over, adult: Secondary | ICD-10-CM

## 2018-11-01 MED ORDER — VITAMIN D (ERGOCALCIFEROL) 1.25 MG (50000 UNIT) PO CAPS: 50000.0000 [IU] | ORAL_CAPSULE | ORAL | 0 refills | Status: DC

## 2018-11-05 NOTE — Progress Notes (Signed)
Office: [phone]  /  Fax: [phone] TeleHealth Visit:  Tamara Diaz has consented to this TeleHealth visit today via telephone call via Webex. The patient is located at home, the provider is located at the [facility] office. The participants in this visit include the listed provider and patient and provider's assistant.   HPI:   Chief Complaint: OBESITY Tamara Diaz is here to discuss her progress with her obesity treatment plan. She is on the Category 3 plan and is following her eating plan approximately 90 % of the time. She states she is walking the dogs for 3 minutes 2 times a day. [name] is not having issues finding the food for the plan. She lives in [place] and the stores are stocked. She notes obstacles in the next few weeks includes finding all the food. She is doing popcorn, [employer], and che[employer] crackers for snacks. She denies hunger. She plans for lasagna for her birthday and a birthday cake.  We were unable to weight the patient today for this TeleHealth visit. She feels as if she has loss weight since her last visit, clothes fitting loosely. She has lost 1 lb since starting treatment with [name].  Vitamin D Deficiency Tamara Diaz has a diagnosis of vitamin D deficiency. She is currently taking prescription Vit D. She notes fatigue and denies nausea, vomiting or muscle weakness.  Pre-Diabetes Tamara Diaz has a diagnosis of pre-diabetes based on her elevated Hgb A1c and was informed this puts her at greater risk of developing diabetes. She denies carbohydrate cravings and she is not on medications. She continues to work on diet and exercise to decrease risk of diabetes. She denies nausea or hypoglycemia.  ASSESSMENT AND PLAN:  Vitamin D deficiency - Plan: Vitamin D, Ergocalciferol, (DRISDOL) 1.25 MG (50000 UT) CAPS capsule  Prediabetes  Class 3 severe obesity with serious comorbidity and body mass index (BMI) of 45.0 to 49.9 in adult, unspecified obesity type  (HCC)  PLAN:  Vitamin D Deficiency Avan was informed that low vitamin D levels contributes to fatigue and are associated with obesity, breast, and colon cancer. Klover agrees to continue taking prescription Vit D @50 ,000 IU every week #4 and we will refill for 1 month. She will follow up for routine testing of vitamin D, at least 2-3 times per year. She was informed of the risk of over-replacement of vitamin D and agrees to not increase her dose unless she discusses this with [name] first. Nafia agrees to follow up with our clinic in 2 weeks.  Pre-Diabetes Tamara Diaz will continue to work on weight loss, exercise, and decreasing simple carbohydrates in her diet to help decrease the risk of diabetes. We dicussed metformin including benefits and risks. She was informed that eating too many simple carbohydrates or too many calories at one sitting increases the likelihood of GI side effects. Tamara Diaz declined metformin for now and a prescription was not written today. We will try to get labs from [facility], NP. [name] agrees to follow up with our clinic in 2 weeks as directed to monitor her progress.  Obesity Tamara Diaz is currently in the action stage of change. As such, her goal is to continue with weight loss efforts She has agreed to follow the Category 3 plan Tamara Diaz has been instructed to work up to a goal of 150 minutes of combined cardio and strengthening exercise per week for weight loss and overall health benefits. We discussed the following Behavioral Modification Strategies today: increasing lean protein intake, increasing  vegetables, dealing with family or coworker sabotage, and planning for success   [name] has agreed to follow up with our clinic in 2 weeks. She was informed of the importance of frequent follow up visits to maximize her success with intensive lifestyle modifications for her multiple health conditions.  ALLERGIES: Allergies  Allergen Reactions  . Codeine      insomnia   . Lactose Intolerance (Gi) Diarrhea  . Zinc Oxide Rash    MEDICATIONS: Current Outpatient Medications on File Prior to Visit  Medication Sig Dispense Refill  . acetaminophen (TYLENOL) 325 MG tablet Take 650 mg by mouth every 6 (six) hours as needed.    . cetirizine (ZYRTEC) 10 MG tablet Take 10 mg by mouth daily.    . fluticasone (FLONASE) 50 MCG/ACT nasal spray Place 2 sprays into both nostrils daily. 16 g 6  . Melatonin 5 MG TABS Take 1 tablet by mouth at bedtime.    . montelukast (SINGULAIR) 10 MG tablet Take 10 mg by mouth at bedtime.     No current facility-administered medications on file prior to visit.     PAST MEDICAL HISTORY: Past Medical History:  Diagnosis Date  . Anxiety   . Back pain   . Depression   . Environmental allergies   . Fatigue   . Lactose intolerance   . Migraines   . Prediabetes   . Vitamin D deficiency     PAST SURGICAL HISTORY: Past Surgical History:  Procedure Laterality Date  . APPENDECTOMY    . CYSTECTOMY    . TONSILLECTOMY AND ADENOIDECTOMY      SOCIAL HISTORY: Social History   Tobacco Use  . Smoking status: Never Smoker  . Smokeless tobacco: Never Used  Substance Use Topics  . Alcohol use: Yes    Comment: socially  . Drug use: Not on file    FAMILY HISTORY: Family History  Problem Relation Age of Onset  . Diabetes Father   . Hypertension Father   . Heart disease Father   . Depression Father   . Anxiety disorder Father   . Bipolar disorder Father   . Obesity Father     ROS: Review of Systems  Constitutional: Positive for malaise/fatigue and weight loss.  Gastrointestinal: Negative for nausea and vomiting.  Musculoskeletal:       Negative muscle weakness  Endo/Heme/Allergies:       Negative hypoglycemia    PHYSICAL EXAM: Pt in no acute distress  RECENT LABS AND TESTS: BMET    Component Value Date/Time   NA 139 01/09/2012 2210   K 3.9 01/09/2012 2210   CL 103 01/09/2012 2210   CO2 26 01/09/2012  2210   GLUCOSE 88 01/09/2012 2210   BUN 11 01/09/2012 2210   CREATININE 0.66 01/09/2012 2210   CALCIUM 9.7 01/09/2012 2210   GFRNONAA >90 01/09/2012 2210   GFRAA >90 01/09/2012 2210   No results found for: HGBA1C No results found for: INSULIN CBC    Component Value Date/Time   WBC 14.1 (H) 01/09/2012 2210   RBC 5.48 (H) 01/09/2012 2210   HGB 11.3 (L) 01/09/2012 2210   HCT 37.1 01/09/2012 2210   PLT 410 (H) 01/09/2012 2210   MCV 67.7 (L) 01/09/2012 2210   MCH 20.6 (L) 01/09/2012 2210   MCHC 30.5 01/09/2012 2210   RDW 17.9 (H) 01/09/2012 2210   LYMPHSABS 5.1 (H) 01/09/2012 2210   MONOABS 0.6 01/09/2012 2210   EOSABS 0.1 01/09/2012 2210   BASOSABS 0.1 01/09/2012 2210  Iron/TIBC/Ferritin/ %Sat No results found for: IRON, TIBC, FERRITIN, IRONPCTSAT Lipid Panel  No results found for: CHOL, TRIG, HDL, CHOLHDL, VLDL, LDLCALC, LDLDIRECT Hepatic Function Panel     Component Value Date/Time   PROT 7.8 01/09/2012 2210   ALBUMIN 4.0 01/09/2012 2210   AST 23 01/09/2012 2210   ALT 31 01/09/2012 2210   ALKPHOS 55 01/09/2012 2210   BILITOT 0.2 (L) 01/09/2012 2210      Component Value Date/Time   TSH 4.090 09/27/2018 1153      I, Burt Knack, am acting as transcriptionist for Debbra Riding, MD  I have reviewed the above documentation for accuracy and completeness, and I agree with the above. - Debbra Riding, MD

## 2018-11-08 DIAGNOSIS — J3089 Other allergic rhinitis: Secondary | ICD-10-CM | POA: Diagnosis not present

## 2018-11-08 DIAGNOSIS — J301 Allergic rhinitis due to pollen: Secondary | ICD-10-CM | POA: Diagnosis not present

## 2018-11-15 ENCOUNTER — Ambulatory Visit (INDEPENDENT_AMBULATORY_CARE_PROVIDER_SITE_OTHER): Payer: 59 | Admitting: Family Medicine

## 2018-11-15 ENCOUNTER — Other Ambulatory Visit: Payer: Self-pay

## 2018-11-15 ENCOUNTER — Encounter (INDEPENDENT_AMBULATORY_CARE_PROVIDER_SITE_OTHER): Payer: Self-pay | Admitting: Family Medicine

## 2018-11-15 DIAGNOSIS — Z6841 Body Mass Index (BMI) 40.0 and over, adult: Secondary | ICD-10-CM | POA: Diagnosis not present

## 2018-11-15 DIAGNOSIS — J3089 Other allergic rhinitis: Secondary | ICD-10-CM | POA: Diagnosis not present

## 2018-11-15 DIAGNOSIS — R7303 Prediabetes: Secondary | ICD-10-CM | POA: Diagnosis not present

## 2018-11-15 DIAGNOSIS — J301 Allergic rhinitis due to pollen: Secondary | ICD-10-CM | POA: Diagnosis not present

## 2018-11-19 NOTE — Progress Notes (Signed)
Office: [phone]  /  Fax: [phone] TeleHealth Visit:  Tamara Diaz has verbally consented to this TeleHealth visit today. The patient is located at home, the provider is located at the [facility] office. The participants in this visit include the listed provider and patient and provider's assistant. The visit was conducted today via webex.  HPI:   Chief Complaint: OBESITY Tamara Diaz is here to discuss her progress with her obesity treatment plan. She is on the Category 3 plan and is following her eating plan approximately 80 % of the time. She states she is walking for 20-30 minutes 7 times per week. Tamara Diaz voices the past few weeks has been slightly harder to get food. Food is limited to the number of packages of meat. She voices concern over uncertainty of the next few weeks.  We were unable to weigh the patient today for this TeleHealth visit. She feels as if she has maintained her weight since her last visit. She has lost 1 lb since starting treatment with [name].  Pre-Diabetes Tamara Diaz has a diagnosis of pre-diabetes based on her elevated Hgb A1c and was informed this puts her at greater risk of developing diabetes. She notes carbohydrate cravings for snacks, and she is not on medications. She continues to work on diet and exercise to decrease risk of diabetes. She denies nausea or hypoglycemia.  Allergic Rhinitis Tamara Diaz notes symptoms with increase in Spring blooming. She is taking Claritin or Zyrtec and Flonase or Nasacort.  ASSESSMENT AND PLAN:  Prediabetes  Allergic rhinitis due to other allergic trigger, unspecified seasonality  Class 3 severe obesity with serious comorbidity and body mass index (BMI) of 45.0 to 49.9 in adult, unspecified obesity type [facility])  PLAN:  Pre-Diabetes Tamara Diaz will continue to work on weight loss, exercise, and decreasing simple carbohydrates in her diet to help decrease the risk of diabetes. We dicussed metformin including  benefits and risks. She was informed that eating too many simple carbohydrates or too many calories at one sitting increases the likelihood of GI side effects. Tamara Diaz declined metformin for now and a prescription was not written today. We will repeat Hgb A1c in mid May. Tamara Diaz agrees to follow up with our clinic in 2 weeks as directed to monitor her progress.  Allergic Rhinitis Tamara Diaz is supposed to start allergy shots in 5 days. Tamara Diaz agrees to follow up with our clinic in 2 weeks.  Obesity Tamara Diaz is currently in the action stage of change. As such, her goal is to continue with weight loss efforts She has agreed to keep a food journal with 450-600 calories and 35+ grams of protein at supper daily and follow the Category 3 plan Tamara Diaz has been instructed to work up to a goal of 150 minutes of combined cardio and strengthening exercise per week for weight loss and overall health benefits. We discussed the following Behavioral Modification Strategies today: decreasing simple carbohydrates, increasing vegetables, emotional eating strategies, ways to avoid boredom eating, avoiding temptations, and planning for success   Tamara Diaz has agreed to follow up with our clinic in 2 weeks. She was informed of the importance of frequent follow up visits to maximize her success with intensive lifestyle modifications for her multiple health conditions.  ALLERGIES: Allergies  Allergen Reactions  . Codeine     insomnia   . Lactose Intolerance (Gi) Diarrhea  . Zinc Oxide Rash    MEDICATIONS: Current Outpatient Medications on File Prior to Visit  Medication Sig Dispense Refill  . acetaminophen (TYLENOL)  325 MG tablet Take 650 mg by mouth every 6 (six) hours as needed.    . cetirizine (ZYRTEC) 10 MG tablet Take 10 mg by mouth daily.    . fluticasone (FLONASE) 50 MCG/ACT nasal spray Place 2 sprays into both nostrils daily. 16 g 6  . Melatonin 5 MG TABS Take 1 tablet by mouth at bedtime.    .  montelukast (SINGULAIR) 10 MG tablet Take 10 mg by mouth at bedtime.    . Vitamin D, Ergocalciferol, (DRISDOL) 1.25 MG (50000 UT) CAPS capsule Take 1 capsule (50,000 Units total) by mouth every 7 (seven) days. 4 capsule 0   No current facility-administered medications on file prior to visit.     PAST MEDICAL HISTORY: Past Medical History:  Diagnosis Date  . Anxiety   . Back pain   . Depression   . Environmental allergies   . Fatigue   . Lactose intolerance   . Migraines   . Prediabetes   . Vitamin D deficiency     PAST SURGICAL HISTORY: Past Surgical History:  Procedure Laterality Date  . APPENDECTOMY    . CYSTECTOMY    . TONSILLECTOMY AND ADENOIDECTOMY      SOCIAL HISTORY: Social History   Tobacco Use  . Smoking status: Never Smoker  . Smokeless tobacco: Never Used  Substance Use Topics  . Alcohol use: Yes    Comment: socially  . Drug use: Not on file    FAMILY HISTORY: Family History  Problem Relation Age of Onset  . Diabetes Father   . Hypertension Father   . Heart disease Father   . Depression Father   . Anxiety disorder Father   . Bipolar disorder Father   . Obesity Father     ROS: Review of Systems  Constitutional: Negative for weight loss.  Gastrointestinal: Negative for nausea.  Endo/Heme/Allergies:       Negative hypoglycemia    PHYSICAL EXAM: Pt in no acute distress  RECENT LABS AND TESTS: BMET    Component Value Date/Time   NA 139 01/09/2012 2210   K 3.9 01/09/2012 2210   CL 103 01/09/2012 2210   CO2 26 01/09/2012 2210   GLUCOSE 88 01/09/2012 2210   BUN 11 01/09/2012 2210   CREATININE 0.66 01/09/2012 2210   CALCIUM 9.7 01/09/2012 2210   GFRNONAA >90 01/09/2012 2210   GFRAA >90 01/09/2012 2210   No results found for: HGBA1C No results found for: INSULIN CBC    Component Value Date/Time   WBC 14.1 (H) 01/09/2012 2210   RBC 5.48 (H) 01/09/2012 2210   HGB 11.3 (L) 01/09/2012 2210   HCT 37.1 01/09/2012 2210   PLT 410 (H)  01/09/2012 2210   MCV 67.7 (L) 01/09/2012 2210   MCH 20.6 (L) 01/09/2012 2210   MCHC 30.5 01/09/2012 2210   RDW 17.9 (H) 01/09/2012 2210   LYMPHSABS 5.1 (H) 01/09/2012 2210   MONOABS 0.6 01/09/2012 2210   EOSABS 0.1 01/09/2012 2210   BASOSABS 0.1 01/09/2012 2210   Iron/TIBC/Ferritin/ %Sat No results found for: IRON, TIBC, FERRITIN, IRONPCTSAT Lipid Panel  No results found for: CHOL, TRIG, HDL, CHOLHDL, VLDL, LDLCALC, LDLDIRECT Hepatic Function Panel     Component Value Date/Time   PROT 7.8 01/09/2012 2210   ALBUMIN 4.0 01/09/2012 2210   AST 23 01/09/2012 2210   ALT 31 01/09/2012 2210   ALKPHOS 55 01/09/2012 2210   BILITOT 0.2 (L) 01/09/2012 2210      Component Value Date/Time   TSH 4.090 09/27/2018  1153      I, Burt Knack, am acting as transcriptionist for Debbra Riding, MD  I have reviewed the above documentation for accuracy and completeness, and I agree with the above. - Debbra Riding, MD

## 2018-11-22 DIAGNOSIS — J301 Allergic rhinitis due to pollen: Secondary | ICD-10-CM | POA: Diagnosis not present

## 2018-11-22 DIAGNOSIS — J3089 Other allergic rhinitis: Secondary | ICD-10-CM | POA: Diagnosis not present

## 2018-11-27 ENCOUNTER — Encounter: Payer: Self-pay | Admitting: Women's Health

## 2018-11-27 ENCOUNTER — Ambulatory Visit (INDEPENDENT_AMBULATORY_CARE_PROVIDER_SITE_OTHER): Payer: 59 | Admitting: Women's Health

## 2018-11-27 ENCOUNTER — Other Ambulatory Visit: Payer: Self-pay

## 2018-11-27 VITALS — BP 118/78 | Ht 67.0 in | Wt 309.0 lb

## 2018-11-27 DIAGNOSIS — Z01419 Encounter for gynecological examination (general) (routine) without abnormal findings: Secondary | ICD-10-CM | POA: Diagnosis not present

## 2018-11-27 DIAGNOSIS — J301 Allergic rhinitis due to pollen: Secondary | ICD-10-CM | POA: Diagnosis not present

## 2018-11-27 DIAGNOSIS — J3089 Other allergic rhinitis: Secondary | ICD-10-CM | POA: Diagnosis not present

## 2018-11-27 NOTE — Progress Notes (Signed)
Tamara Diaz [birth year] [id number]    History:    Presents for annual exam.  Monthly 4 to 5-day cycle.  Not sexually active greater than 2 years, denies need for STD screen.  Reports normal Pap history.  Had 1 dose of Gardasil only.  Primary care manages Tamara Diaz/depression, labs and meds, prediabetic.  Currently at the [facility] healthy weight loss program.  Had been on OCs in the past would like the IUD, is in a new relationship but has not been sexually active. . Past medical history, past surgical history, family history and social history were all reviewed and documented in the EPIC chart.  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  Father MI, had weight loss, doing well and off meds for diabetes.  Mother no known health problems..  ROS:  A ROS was performed and pertinent positives and negatives are included.  Exam:  Vitals:   11/27/18 0856  BP: 118/78  Weight: (!) 309 lb (140.2 kg)  Height: 5\' 7"  (1.702 m)   Body mass index is 48.4 kg/m.   General appearance:  Normal Thyroid:  Symmetrical, normal in size, without palpable masses or nodularity. Respiratory  Auscultation:  Clear without wheezing or rhonchi Cardiovascular  Auscultation:  Regular rate, without rubs, murmurs or gallops  Edema/varicosities:  Not grossly evident Abdominal  Soft,nontender, without masses, guarding or rebound.  Liver/spleen:  No organomegaly noted  Hernia:  None appreciated  Skin  Inspection:  Grossly normal large 2 cm irritated skin tag on right inner thigh   Breasts: Examined lying and sitting.     Right: Without masses, retractions, discharge or axillary adenopathy.     Left: Without masses, retractions, discharge or axillary adenopathy. Gentitourinary   Inguinal/mons:  Normal without inguinal adenopathy  External genitalia:  Normal  BUS/Urethra/Skene's glands:  Normal  Vagina:  Normal  Cervix:  Normal  Uterus:   normal in size, shape and contour.  Midline and mobile  Adnexa/parametria:      Rt: Without masses or tenderness.   Lt: Without masses or tenderness.  Anus and perineum: Normal    Assessment/Plan:  28 y.o. S W[place] G0 for annual exam with no complaints.  Regular monthly cycles/contraception management Right upper inner thigh 2 cm skin tag Primary care manages labs and meds for asthma Morbid obesity- currently in health program for healthy weight at Tamara Diaz  Plan: Instructed to take over-the-counter vitamin D 2000 daily after completing 50,000 IUs weekly.  SBEs, increase regular cardio type exercise, encouraged 30-minute daily walk.  Continue healthy weight program at Tamara Diaz.  Contraception options reviewed, would like IUD, Tamara Diaz IUD information given, reviewed slight risk for infection, perforation, hemorrhage.  Instructed to schedule with Tamara Diaz with next cycle.  We will have Tamara Diaz remove right inner upper thigh skin tag at IUD appointment.  Reviewed importance of condoms until permanent partner.  Pap.    Tamara Diaz Tamara Diaz, 9:12 AM 11/27/2018

## 2018-11-27 NOTE — Patient Instructions (Addendum)
Diabetes Mellitus and Nutrition, Adult When you have diabetes (diabetes mellitus), it is very important to have healthy eating habits because your blood sugar (glucose) levels are greatly affected by what you eat and drink. Eating healthy foods in the appropriate amounts, at about the same times every day, can help you:  Control your blood glucose.  Lower your risk of heart disease.  Improve your blood pressure.  Reach or maintain a healthy weight. Every person with diabetes is different, and each person has different needs for a meal plan. Your health care provider may recommend that you work with a diet and nutrition specialist (dietitian) to make a meal plan that is best for you. Your meal plan may vary depending on factors such as:  The calories you need.  The medicines you take.  Your weight.  Your blood glucose, blood pressure, and cholesterol levels.  Your activity level.  Other health conditions you have, such as heart or kidney disease. How do carbohydrates affect me? Carbohydrates, also called carbs, affect your blood glucose level more than any other type of food. Eating carbs naturally raises the amount of glucose in your blood. Carb counting is a method for keeping track of how many carbs you eat. Counting carbs is important to keep your blood glucose at a healthy level, especially if you use insulin or take certain oral diabetes medicines. It is important to know how many carbs you can safely have in each meal. This is different for every person. Your dietitian can help you calculate how many carbs you should have at each meal and for each snack. Foods that contain carbs include:  Bread, cereal, rice, pasta, and crackers.  Potatoes and corn.  Peas, beans, and lentils.  Milk and yogurt.  Fruit and juice.  Desserts, such as cakes, cookies, ice cream, and candy. How does alcohol affect me? Alcohol can cause a sudden decrease in blood glucose (hypoglycemia),  especially if you use insulin or take certain oral diabetes medicines. Hypoglycemia can be a life-threatening condition. Symptoms of hypoglycemia (sleepiness, dizziness, and confusion) are similar to symptoms of having too much alcohol. If your health care provider says that alcohol is safe for you, follow these guidelines:  Limit alcohol intake to no more than 1 drink per day for nonpregnant women and 2 drinks per day for men. One drink equals 12 oz of beer, 5 oz of wine, or 1 oz of hard liquor.  Do not drink on an empty stomach.  Keep yourself hydrated with water, diet soda, or unsweetened iced tea.  Keep in mind that regular soda, juice, and other mixers may contain a lot of sugar and must be counted as carbs. What are tips for following this plan?  Reading food labels  Start by checking the serving size on the "Nutrition Facts" label of packaged foods and drinks. The amount of calories, carbs, fats, and other nutrients listed on the label is based on one serving of the item. Many items contain more than one serving per package.  Check the total grams (g) of carbs in one serving. You can calculate the number of servings of carbs in one serving by dividing the total carbs by 15. For example, if a food has 30 g of total carbs, it would be equal to 2 servings of carbs.  Check the number of grams (g) of saturated and trans fats in one serving. Choose foods that have low or no amount of these fats.  Check the number of   milligrams (mg) of salt (sodium) in one serving. Most people should limit total sodium intake to less than 2,300 mg per day.  Always check the nutrition information of foods labeled as "low-fat" or "nonfat". These foods may be higher in added sugar or refined carbs and should be avoided.  Talk to your dietitian to identify your daily goals for nutrients listed on the label. Shopping  Avoid buying canned, premade, or processed foods. These foods tend to be high in fat, sodium,  and added sugar.  Shop around the outside edge of the grocery store. This includes fresh fruits and vegetables, bulk grains, fresh meats, and fresh dairy. Cooking  Use low-heat cooking methods, such as baking, instead of high-heat cooking methods like deep frying.  Cook using healthy oils, such as olive, canola, or sunflower oil.  Avoid cooking with butter, cream, or high-fat meats. Meal planning  Eat meals and snacks regularly, preferably at the same times every day. Avoid going long periods of time without eating.  Eat foods high in fiber, such as fresh fruits, vegetables, beans, and whole grains. Talk to your dietitian about how many servings of carbs you can eat at each meal.  Eat 4-6 ounces (oz) of lean protein each day, such as lean meat, chicken, fish, eggs, or tofu. One oz of lean protein is equal to: ? 1 oz of meat, chicken, or fish. ? 1 egg. ?  cup of tofu.  Eat some foods each day that contain healthy fats, such as avocado, nuts, seeds, and fish. Lifestyle  Check your blood glucose regularly.  Exercise regularly as told by your health care provider. This may include: ? 150 minutes of moderate-intensity or vigorous-intensity exercise each week. This could be brisk walking, biking, or water aerobics. ? Stretching and doing strength exercises, such as yoga or weightlifting, at least 2 times a week.  Take medicines as told by your health care provider.  Do not use any products that contain nicotine or tobacco, such as cigarettes and e-cigarettes. If you need help quitting, ask your health care provider.  Work with a Social worker or diabetes educator to identify strategies to manage stress and any emotional and social challenges. Questions to ask a health care provider  Do I need to meet with a diabetes educator?  Do I need to meet with a dietitian?  What number can I call if I have questions?  When are the best times to check my blood glucose? Where to find more  information:  American Diabetes Association: diabetes.org  Academy of Nutrition and Dietetics: www.eatright.[employer] of Diabetes and Digestive and Kidney Diseases (NIH): [employer] Summary  A healthy meal plan will help you control your blood glucose and maintain a healthy lifestyle.  Working with a diet and nutrition specialist (dietitian) can help you make a meal plan that is best for you.  Keep in mind that carbohydrates (carbs) and alcohol have immediate effects on your blood glucose levels. It is important to count carbs and to use alcohol carefully. This information is not intended to replace advice given to you by your health care provider. Make sure you discuss any questions you have with your health care provider. Document Released: 04/21/2005 Document Revised: 02/22/2017 Document Reviewed: 08/29/2016 Elsevier Interactive Patient Education  2019 [place] Maintenance, Female Adopting a healthy lifestyle and getting preventive care can go a long way to promote health and wellness. Talk with your health care provider about what schedule of regular examinations is right  for you. This is a good chance for you to check in with your provider about disease prevention and staying healthy. In between checkups, there are plenty of things you can do on your own. Experts have done a lot of research about which lifestyle changes and preventive measures are most likely to keep you healthy. Ask your health care provider for more information. Weight and diet Eat a healthy diet  Be sure to include plenty of vegetables, fruits, low-fat dairy products, and lean protein.  Do not eat a lot of foods high in solid fats, added sugars, or salt.  Get regular exercise. This is one of the most important things you can do for your health. ? Most adults should exercise for at least 150 minutes each week. The exercise should increase your heart rate and make you sweat  (moderate-intensity exercise). ? Most adults should also do strengthening exercises at least twice a week. This is in addition to the moderate-intensity exercise. Maintain a healthy weight  Body mass index (BMI) is a measurement that can be used to identify possible weight problems. It estimates body fat based on height and weight. Your health care provider can help determine your BMI and help you achieve or maintain a healthy weight.  For females 49 years of age and older: ? A BMI below 18.5 is considered underweight. ? A BMI of 18.5 to 24.9 is normal. ? A BMI of 25 to 29.9 is considered overweight. ? A BMI of 30 and above is considered obese. Watch levels of cholesterol and blood lipids  You should start having your blood tested for lipids and cholesterol at 26 years of age, then have this test every 5 years.  You may need to have your cholesterol levels checked more often if: ? Your lipid or cholesterol levels are high. ? You are older than 26 years of age. ? You are at high risk for heart disease. Cancer screening Lung Cancer  Lung cancer screening is recommended for adults 74-31 years old who are at high risk for lung cancer because of a history of smoking.  A yearly low-dose CT scan of the lungs is recommended for people who: ? Currently smoke. ? Have quit within the past 15 years. ? Have at least a 30-pack-year history of smoking. A pack year is smoking an average of one pack of cigarettes a day for 1 year.  Yearly screening should continue until it has been 15 years since you quit.  Yearly screening should stop if you develop a health problem that would prevent you from having lung cancer treatment. Breast Cancer  Practice breast self-awareness. This means understanding how your breasts normally appear and feel.  It also means doing regular breast self-exams. Let your health care provider know about any changes, no matter how small.  If you are in your 20s or 30s, you  should have a clinical breast exam (CBE) by a health care provider every 1-3 years as part of a regular health exam.  If you are 46 or older, have a CBE every year. Also consider having a breast X-ray (mammogram) every year.  If you have a family history of breast cancer, talk to your health care provider about genetic screening.  If you are at high risk for breast cancer, talk to your health care provider about having an MRI and a mammogram every year.  Breast cancer gene (BRCA) assessment is recommended for women who have family members with BRCA-related cancers. BRCA-related cancers include: ?  Breast. ? Ovarian. ? Tubal. ? Peritoneal cancers.  Results of the assessment will determine the need for genetic counseling and BRCA1 and BRCA2 testing. Cervical Cancer Your health care provider may recommend that you be screened regularly for cancer of the pelvic organs (ovaries, uterus, and vagina). This screening involves a pelvic examination, including checking for microscopic changes to the surface of your cervix (Pap test). You may be encouraged to have this screening done every 3 years, beginning at age 74.  For women ages 70-65, health care providers may recommend pelvic exams and Pap testing every 3 years, or they may recommend the Pap and pelvic exam, combined with testing for human papilloma virus (HPV), every 5 years. Some types of HPV increase your risk of cervical cancer. Testing for HPV may also be done on women of any age with unclear Pap test results.  Other health care providers may not recommend any screening for nonpregnant women who are considered low risk for pelvic cancer and who do not have symptoms. Ask your health care provider if a screening pelvic exam is right for you.  If you have had past treatment for cervical cancer or a condition that could lead to cancer, you need Pap tests and screening for cancer for at least 20 years after your treatment. If Pap tests have been  discontinued, your risk factors (such as having a new sexual partner) need to be reassessed to determine if screening should resume. Some women have medical problems that increase the chance of getting cervical cancer. In these cases, your health care provider may recommend more frequent screening and Pap tests. Colorectal Cancer  This type of cancer can be detected and often prevented.  Routine colorectal cancer screening usually begins at 26 years of age and continues through 26 years of age.  Your health care provider may recommend screening at an earlier age if you have risk factors for colon cancer.  Your health care provider may also recommend using home test kits to check for hidden blood in the stool.  A small camera at the end of a tube can be used to examine your colon directly (sigmoidoscopy or colonoscopy). This is done to check for the earliest forms of colorectal cancer.  Routine screening usually begins at age 36.  Direct examination of the colon should be repeated every 5-10 years through 26 years of age. However, you may need to be screened more often if early forms of precancerous polyps or small growths are found. Skin Cancer  Check your skin from head to toe regularly.  Tell your health care provider about any new moles or changes in moles, especially if there is a change in a mole's shape or color.  Also tell your health care provider if you have a mole that is larger than the size of a pencil eraser.  Always use sunscreen. Apply sunscreen liberally and repeatedly throughout the day.  Protect yourself by wearing long sleeves, pants, a wide-brimmed hat, and sunglasses whenever you are outside. Heart disease, diabetes, and high blood pressure  High blood pressure causes heart disease and increases the risk of stroke. High blood pressure is more likely to develop in: ? People who have blood pressure in the high end of the normal range (130-139/85-89 mm Hg). ? People  who are overweight or obese. ? People who are African American.  If you are 24-37 years of age, have your blood pressure checked every 3-5 years. If you are 81 years of age or  older, have your blood pressure checked every year. You should have your blood pressure measured twice-once when you are at a hospital or clinic, and once when you are not at a hospital or clinic. Record the average of the two measurements. To check your blood pressure when you are not at a hospital or clinic, you can use: ? An automated blood pressure machine at a pharmacy. ? A home blood pressure monitor.  If you are between 59 years and 25 years old, ask your health care provider if you should take aspirin to prevent strokes.  Have regular diabetes screenings. This involves taking a blood sample to check your fasting blood sugar level. ? If you are at a normal weight and have a low risk for diabetes, have this test once every three years after 26 years of age. ? If you are overweight and have a high risk for diabetes, consider being tested at a younger age or more often. Preventing infection Hepatitis B  If you have a higher risk for hepatitis B, you should be screened for this virus. You are considered at high risk for hepatitis B if: ? You were born in a country where hepatitis B is common. Ask your health care provider which countries are considered high risk. ? Your parents were born in a high-risk country, and you have not been immunized against hepatitis B (hepatitis B vaccine). ? You have HIV or AIDS. ? You use needles to inject street drugs. ? You live with someone who has hepatitis B. ? You have had sex with someone who has hepatitis B. ? You get hemodialysis treatment. ? You take certain medicines for conditions, including cancer, organ transplantation, and autoimmune conditions. Hepatitis C  Blood testing is recommended for: ? Everyone born from [birth year] through 1963. ? Anyone with known risk factors for  hepatitis C. Sexually transmitted infections (STIs)  You should be screened for sexually transmitted infections (STIs) including gonorrhea and chlamydia if: ? You are sexually active and are younger than 26 years of age. ? You are older than 27 years of age and your health care provider tells you that you are at risk for this type of infection. ? Your sexual activity has changed since you were last screened and you are at an increased risk for chlamydia or gonorrhea. Ask your health care provider if you are at risk.  If you do not have HIV, but are at risk, it may be recommended that you take a prescription medicine daily to prevent HIV infection. This is called pre-exposure prophylaxis (PrEP). You are considered at risk if: ? You are sexually active and do not regularly use condoms or know the HIV status of your partner(s). ? You take drugs by injection. ? You are sexually active with a partner who has HIV. Talk with your health care provider about whether you are at high risk of being infected with HIV. If you choose to begin PrEP, you should first be tested for HIV. You should then be tested every 3 months for as long as you are taking PrEP. Pregnancy  If you are premenopausal and you may become pregnant, ask your health care provider about preconception counseling.  If you may become pregnant, take 400 to 800 micrograms (mcg) of folic acid every day.  If you want to prevent pregnancy, talk to your health care provider about birth control (contraception). Osteoporosis and menopause  Osteoporosis is a disease in which the bones lose minerals and strength with aging.  This can result in serious bone fractures. Your risk for osteoporosis can be identified using a bone density scan.  If you are 64 years of age or older, or if you are at risk for osteoporosis and fractures, ask your health care provider if you should be screened.  Ask your health care provider whether you should take a calcium  or vitamin D supplement to lower your risk for osteoporosis.  Menopause may have certain physical symptoms and risks.  Hormone replacement therapy may reduce some of these symptoms and risks. Talk to your health care provider about whether hormone replacement therapy is right for you. Follow these instructions at home:  Schedule regular health, dental, and eye exams.  Stay current with your immunizations.  Do not use any tobacco products including cigarettes, chewing tobacco, or electronic cigarettes.  If you are pregnant, do not drink alcohol.  If you are breastfeeding, limit how much and how often you drink alcohol.  Limit alcohol intake to no more than 1 drink per day for nonpregnant women. One drink equals 12 ounces of beer, 5 ounces of wine, or 1 ounces of hard liquor.  Do not use street drugs.  Do not share needles.  Ask your health care provider for help if you need support or information about quitting drugs.  Tell your health care provider if you often feel depressed.  Tell your health care provider if you have ever been abused or do not feel safe at home. This information is not intended to replace advice given to you by your health care provider. Make sure you discuss any questions you have with your health care provider. Document Released: 02/07/2011 Document Revised: 12/31/2015 Document Reviewed: 04/28/2015 Elsevier Interactive Patient Education  2019 [employer]. Levonorgestrel intrauterine device (IUD) What is this medicine? LEVONORGESTREL IUD (LEE voe nor jes trel) is a contraceptive (birth control) device. The device is placed inside the uterus by a healthcare professional. It is used to prevent pregnancy. This device can also be used to treat heavy bleeding that occurs during your period. This medicine may be used for other purposes; ask your health care provider or pharmacist if you have questions. COMMON BRAND NAME(S): Minette Headland What  should I tell my health care provider before I take this medicine? They need to know if you have any of these conditions: -abnormal Pap smear -cancer of the breast, uterus, or cervix -diabetes -endometritis -genital or pelvic infection now or in the past -have more than one sexual partner or your partner has more than one partner -heart disease -history of an ectopic or tubal pregnancy -immune system problems -IUD in place -liver disease or tumor -problems with blood clots or take blood-thinners -seizures -use intravenous drugs -uterus of unusual shape -vaginal bleeding that has not been explained -an unusual or allergic reaction to levonorgestrel, other hormones, silicone, or polyethylene, medicines, foods, dyes, or preservatives -pregnant or trying to get pregnant -breast-feeding How should I use this medicine? This device is placed inside the uterus by a health care professional. Talk to your pediatrician regarding the use of this medicine in children. Special care may be needed. Overdosage: If you think you have taken too much of this medicine contact a poison control center or emergency room at once. NOTE: This medicine is only for you. Do not share this medicine with others. What if I miss a dose? This does not apply. Depending on the brand of device you have inserted, the device will need to be  replaced every 3 to 5 years if you wish to continue using this type of birth control. What may interact with this medicine? Do not take this medicine with any of the following medications: -amprenavir -bosentan -fosamprenavir This medicine may also interact with the following medications: -aprepitant -armodafinil -barbiturate medicines for inducing sleep or treating seizures -bexarotene -boceprevir -griseofulvin -medicines to treat seizures like carbamazepine, ethotoin, felbamate, oxcarbazepine, phenytoin,  topiramate -modafinil -pioglitazone -rifabutin -rifampin -rifapentine -some medicines to treat HIV infection like atazanavir, efavirenz, indinavir, lopinavir, nelfinavir, tipranavir, ritonavir -St. John's wort -warfarin This list may not describe all possible interactions. Give your health care provider a list of all the medicines, herbs, non-prescription drugs, or dietary supplements you use. Also tell them if you smoke, drink alcohol, or use illegal drugs. Some items may interact with your medicine. What should I watch for while using this medicine? Visit your doctor or health care professional for regular check ups. See your doctor if you or your partner has sexual contact with others, becomes HIV positive, or gets a sexual transmitted disease. This product does not protect you against HIV infection (AIDS) or other sexually transmitted diseases. You can check the placement of the IUD yourself by reaching up to the top of your vagina with clean fingers to feel the threads. Do not pull on the threads. It is a good habit to check placement after each menstrual period. Call your doctor right away if you feel more of the IUD than just the threads or if you cannot feel the threads at all. The IUD may come out by itself. You may become pregnant if the device comes out. If you notice that the IUD has come out use a backup birth control method like condoms and call your health care provider. Using tampons will not change the position of the IUD and are okay to use during your period. This IUD can be safely scanned with magnetic resonance imaging (MRI) only under specific conditions. Before you have an MRI, tell your healthcare provider that you have an IUD in place, and which type of IUD you have in place. What side effects may I notice from receiving this medicine? Side effects that you should report to your doctor or health care professional as soon as possible: -allergic reactions like skin rash,  itching or hives, swelling of the face, lips, or tongue -fever, flu-like symptoms -genital sores -high blood pressure -no menstrual period for 6 weeks during use -pain, swelling, warmth in the leg -pelvic pain or tenderness -severe or sudden headache -signs of pregnancy -stomach cramping -sudden shortness of breath -trouble with balance, talking, or walking -unusual vaginal bleeding, discharge -yellowing of the eyes or skin Side effects that usually do not require medical attention (report to your doctor or health care professional if they continue or are bothersome): -acne -breast pain -change in sex drive or performance -changes in weight -cramping, dizziness, or faintness while the device is being inserted -headache -irregular menstrual bleeding within first 3 to 6 months of use -nausea This list may not describe all possible side effects. Call your doctor for medical advice about side effects. You may report side effects to FDA at 1-800-FDA-1088. Where should I keep my medicine? This does not apply. NOTE: This sheet is a summary. It may not cover all possible information. If you have questions about this medicine, talk to your doctor, pharmacist, or health care provider.  2019 Elsevier/Gold Standard (2016-05-06 14:14:56)

## 2018-11-27 NOTE — Addendum Note (Signed)
Addended by: Richardson Chiquito on: 11/27/2018 09:47 AM   Modules accepted: Orders

## 2018-11-28 LAB — PAP IG W/ RFLX HPV ASCU

## 2018-11-29 ENCOUNTER — Ambulatory Visit (INDEPENDENT_AMBULATORY_CARE_PROVIDER_SITE_OTHER): Payer: 59 | Admitting: Family Medicine

## 2018-12-03 ENCOUNTER — Other Ambulatory Visit: Payer: Self-pay

## 2018-12-03 ENCOUNTER — Ambulatory Visit (INDEPENDENT_AMBULATORY_CARE_PROVIDER_SITE_OTHER): Payer: 59 | Admitting: Family Medicine

## 2018-12-03 ENCOUNTER — Encounter (INDEPENDENT_AMBULATORY_CARE_PROVIDER_SITE_OTHER): Payer: Self-pay | Admitting: Family Medicine

## 2018-12-03 DIAGNOSIS — R7303 Prediabetes: Secondary | ICD-10-CM | POA: Diagnosis not present

## 2018-12-03 DIAGNOSIS — Z6841 Body Mass Index (BMI) 40.0 and over, adult: Secondary | ICD-10-CM

## 2018-12-03 DIAGNOSIS — E559 Vitamin D deficiency, unspecified: Secondary | ICD-10-CM | POA: Diagnosis not present

## 2018-12-03 MED FILL — MONTELUKAST SOD 10 MG TAB: 10 | 90 days supply | Qty: 90 | Fill #0

## 2018-12-04 ENCOUNTER — Ambulatory Visit: Payer: 59 | Admitting: Obstetrics & Gynecology

## 2018-12-04 ENCOUNTER — Other Ambulatory Visit (INDEPENDENT_AMBULATORY_CARE_PROVIDER_SITE_OTHER): Payer: Self-pay | Admitting: Family Medicine

## 2018-12-04 DIAGNOSIS — J301 Allergic rhinitis due to pollen: Secondary | ICD-10-CM | POA: Diagnosis not present

## 2018-12-04 DIAGNOSIS — E559 Vitamin D deficiency, unspecified: Secondary | ICD-10-CM

## 2018-12-04 DIAGNOSIS — J3089 Other allergic rhinitis: Secondary | ICD-10-CM | POA: Diagnosis not present

## 2018-12-04 MED ORDER — VITAMIN D (ERGOCALCIFEROL) 1.25 MG (50000 UNIT) PO CAPS: 50000.0000 [IU] | ORAL_CAPSULE | ORAL | 0 refills | Status: DC

## 2018-12-04 NOTE — Progress Notes (Signed)
Office: [phone]  /  Fax: [phone] TeleHealth Visit:  [name] has verbally consented to this TeleHealth visit today. The patient is located at home, the provider is located at the [facility] office. The participants in this visit include the listed provider and patient. The visit was conducted today via webex.  HPI:   Chief Complaint: OBESITY Tamara Diaz is here to discuss her progress with her obesity treatment plan. She is on the keep a food journal with 450-600 calories and 35+ grams of protein at supper daily and follow the Category 3 plan and is following her eating plan approximately 50 % of the time. She states she is walking the dog for 20-30 minutes 7 times per week. Karolyn wasn't as good at planning meal wise the past few weeks. She has food in the house for the meal plan. She finds the meal plan to offers a chance to eat some indulgent foods and still get a good amount of nutrition.  We were unable to weigh the patient today for this TeleHealth visit. She feels as if she has gained 2-3 lbs since her last visit. She has lost 1 lb since starting treatment with [name].  Pre-Diabetes Tamara Diaz has a diagnosis of pre-diabetes based on her elevated Hgb A1c and was informed this puts her at greater risk of developing diabetes. She notes carbohydrate cravings with eating takeout. She is not taking metformin currently and continues to work on diet and exercise to decrease risk of diabetes. She denies nausea or hypoglycemia.  Vitamin D Deficiency Tamara Diaz has a diagnosis of vitamin D deficiency. She is currently taking prescription Vit D. She notes fatigue and denies nausea, vomiting or muscle weakness.  ASSESSMENT AND PLAN:  Prediabetes  Vitamin D deficiency - Plan: Vitamin D, Ergocalciferol, (DRISDOL) 1.25 MG (50000 UT) CAPS capsule  Class 3 severe obesity with serious comorbidity and body mass index (BMI) of 45.0 to 49.9 in adult, unspecified obesity type [facility])   PLAN:  Pre-Diabetes [name] will continue to work on weight loss, exercise, and decreasing simple carbohydrates in her diet to help decrease the risk of diabetes. We dicussed metformin including benefits and risks. She was informed that eating too many simple carbohydrates or too many calories at one sitting increases the likelihood of GI side effects. Caroleen declined metformin for now and a prescription was not written today. We will retest labs in late May. Lamerle agrees to follow up with our clinic in 2 weeks as directed to monitor her progress.  Vitamin D Deficiency Tamara Diaz was informed that low vitamin D levels contributes to fatigue and are associated with obesity, breast, and colon cancer. Tamara Diaz agrees to continue taking prescription Vit D ,000 IU every week #4 and we will refill for 1 month. She will follow up for routine testing of vitamin D, at least 2-3 times per year. She was informed of the risk of over-replacement of vitamin D and agrees to not increase her dose unless she discusses this with [name] first. Tamara Diaz agrees to follow up with our clinic in 2 weeks.  Obesity Tamara Diaz is currently in the action stage of change. As such, her goal is to continue with weight loss efforts She has agreed to follow the Category 3 plan Tamara Diaz has been instructed to work up to a goal of 150 minutes of combined cardio and strengthening exercise per week for weight loss and overall health benefits. We discussed the following Behavioral Modification Strategies today: increasing lean protein intake, increasing vegetables,  decrease eating out and work on meal planning and easy cooking plans, better snacking choices, and planning for success   Tamara Diaz has agreed to follow up with our clinic in 2 weeks. She was informed of the importance of frequent follow up visits to maximize her success with intensive lifestyle modifications for her multiple health conditions.  ALLERGIES: Allergies  Allergen  Reactions  . Codeine     insomnia   . Lactose Intolerance (Gi) Diarrhea  . Zinc Oxide Rash    MEDICATIONS: Current Outpatient Medications on File Prior to Visit  Medication Sig Dispense Refill  . acetaminophen (TYLENOL) 325 MG tablet Take 650 mg by mouth every 6 (six) hours as needed.    . cetirizine (ZYRTEC) 10 MG tablet Take 10 mg by mouth daily.    . fluticasone (FLONASE) 50 MCG/ACT nasal spray Place 2 sprays into both nostrils daily. 16 g 6  . Melatonin 5 MG TABS Take 1 tablet by mouth at bedtime.    . montelukast (SINGULAIR) 10 MG tablet Take 10 mg by mouth at bedtime.     No current facility-administered medications on file prior to visit.     PAST MEDICAL HISTORY: Past Medical History:  Diagnosis Date  . Anxiety   . Back pain   . Depression   . Environmental allergies   . Fatigue   . Lactose intolerance   . Migraines   . Prediabetes   . Vitamin D deficiency     PAST SURGICAL HISTORY: Past Surgical History:  Procedure Laterality Date  . APPENDECTOMY    . CYSTECTOMY    . TONSILLECTOMY AND ADENOIDECTOMY      SOCIAL HISTORY: Social History   Tobacco Use  . Smoking status: Never Smoker  . Smokeless tobacco: Never Used  Substance Use Topics  . Alcohol use: Yes    Comment: socially  . Drug use: Never    FAMILY HISTORY: Family History  Problem Relation Age of Onset  . Diabetes Father   . Hypertension Father   . Heart disease Father   . Depression Father   . Anxiety disorder Father   . Bipolar disorder Father   . Obesity Father     ROS: Review of Systems  Constitutional: Positive for malaise/fatigue. Negative for weight loss.  Gastrointestinal: Negative for nausea and vomiting.  Musculoskeletal:       Negative muscle weakness  Endo/Heme/Allergies:       Negative hypoglycemia    PHYSICAL EXAM: Pt in no acute distress  RECENT LABS AND TESTS: BMET    Component Value Date/Time   NA 139 01/09/2012 2210   K 3.9 01/09/2012 2210   CL 103  01/09/2012 2210   CO2 26 01/09/2012 2210   GLUCOSE 88 01/09/2012 2210   BUN 11 01/09/2012 2210   CREATININE 0.66 01/09/2012 2210   CALCIUM 9.7 01/09/2012 2210   GFRNONAA >90 01/09/2012 2210   GFRAA >90 01/09/2012 2210   No results found for: HGBA1C No results found for: INSULIN CBC    Component Value Date/Time   WBC 14.1 (H) 01/09/2012 2210   RBC 5.48 (H) 01/09/2012 2210   HGB 11.3 (L) 01/09/2012 2210   HCT 37.1 01/09/2012 2210   PLT 410 (H) 01/09/2012 2210   MCV 67.7 (L) 01/09/2012 2210   MCH 20.6 (L) 01/09/2012 2210   MCHC 30.5 01/09/2012 2210   RDW 17.9 (H) 01/09/2012 2210   LYMPHSABS 5.1 (H) 01/09/2012 2210   MONOABS 0.6 01/09/2012 2210   EOSABS 0.1 01/09/2012 2210  BASOSABS 0.1 01/09/2012 2210   Iron/TIBC/Ferritin/ %Sat No results found for: IRON, TIBC, FERRITIN, IRONPCTSAT Lipid Panel  No results found for: CHOL, TRIG, HDL, CHOLHDL, VLDL, LDLCALC, LDLDIRECT Hepatic Function Panel     Component Value Date/Time   PROT 7.8 01/09/2012 2210   ALBUMIN 4.0 01/09/2012 2210   AST 23 01/09/2012 2210   ALT 31 01/09/2012 2210   ALKPHOS 55 01/09/2012 2210   BILITOT 0.2 (L) 01/09/2012 2210      Component Value Date/Time   TSH 4.090 09/27/2018 1153      I, Burt KnackSharon Martin, am acting as transcriptionist for Debbra RidingAlexandria Kadolph, MD  I have reviewed the above documentation for accuracy and completeness, and I agree with the above. - Debbra RidingAlexandria Kadolph, MD

## 2018-12-06 DIAGNOSIS — J301 Allergic rhinitis due to pollen: Secondary | ICD-10-CM | POA: Diagnosis not present

## 2018-12-06 DIAGNOSIS — J3089 Other allergic rhinitis: Secondary | ICD-10-CM | POA: Diagnosis not present

## 2018-12-11 DIAGNOSIS — J3089 Other allergic rhinitis: Secondary | ICD-10-CM | POA: Diagnosis not present

## 2018-12-11 DIAGNOSIS — J301 Allergic rhinitis due to pollen: Secondary | ICD-10-CM | POA: Diagnosis not present

## 2018-12-15 ENCOUNTER — Telehealth: Payer: 59 | Admitting: Physician Assistant

## 2018-12-15 DIAGNOSIS — J301 Allergic rhinitis due to pollen: Secondary | ICD-10-CM

## 2018-12-15 NOTE — Progress Notes (Signed)
E visit for Allergic Rhinitis We are sorry that you are not feeling well.  Here is how we plan to help!  Based on what you have shared with me it looks like you have Allergic Rhinitis.  Rhinitis is when a reaction occurs that causes nasal congestion, runny nose, sneezing, and itching.  Most types of rhinitis are caused by an inflammation and are associated with symptoms in the eyes ears or throat. There are several types of rhinitis.  The most common are acute rhinitis, which is usually caused by a viral illness, allergic or seasonal rhinitis, and nonallergic or year-round rhinitis.  Nasal allergies occur certain times of the year.  Allergic rhinitis is caused when allergens in the air trigger the release of histamine in the body.  Histamine causes itching, swelling, and fluid to build up in the fragile linings of the nasal passages, sinuses and eyelids.  An itchy nose and clear discharge are common.  Continue your current allergy medications.  The flonase and zyrtec are very helpful.  Add in pseudoephedrine.  Purchase this from behind the counter.  You may want to consider a long acting formulation so you don't need to take a dose every four hours.    HOME CARE:  You can use an over-the-counter saline nasal spray as needed Avoid areas where there is heavy dust, mites, or molds Stay indoors on windy days during the pollen season Keep windows closed in home, at least in bedroom; use air conditioner. Use high-efficiency house air filter Keep windows closed in car, turn AC on re-circulate Avoid playing out with dog during pollen season  GET HELP RIGHT AWAY IF:  If your symptoms do not improve within 10 days You become short of breath You develop yellow or green discharge from your nose for over 3 days You have coughing fits  MAKE SURE YOU:  Understand these instructions Will watch your condition Will get help right away if you are not doing well or get worse  Thank you for choosing an  e-visit. Your e-visit answers were reviewed by a board certified advanced clinical practitioner to complete your personal care plan. Depending upon the condition, your plan could have included both over the counter or prescription medications. Please review your pharmacy choice. Be sure that the pharmacy you have chosen is open so that you can pick up your prescription now.  If there is a problem you may message your provider in MyChart to have the prescription routed to another pharmacy. Your safety is important to us. If you have drug allergies check your prescription carefully.  For the next 24 hours, you can use MyChart to ask questions about today's visit, request a non-urgent call back, or ask for a work or school excuse from your e-visit provider. You will get an email in the next two days asking about your experience. I hope that your e-visit has been valuable and will speed your recovery.         ===View-only below this line===   ----- Message -----    From: Tamara Diaz    Sent: 12/15/2018 10:31 AM EDT      To: E-Visit Mailing List Subject: E-Visit Submission: Allergies  E-Visit Submission: Allergies --------------------------------  Question: What symptoms are you having? (Check all that apply) Answer:   Itching of the eyes, nose, and roof of mouth            Ear fullness  Question: Other: Answer:   Ear pressure in left ear  Question: Do you have a fever or chills? Answer:   No  Question: Do you have a cough? Answer:   No  Question: Are you short of breath or wheezing? Answer:   No  Question: Have you ever been diagnosed with asthma? Answer:   No  Question: When did your symptoms start? Answer:   1-5 days ago  Question: Have you tried any over the counter medications? Answer:   Nasal spray such as Nasonex or Nasalcort            Zyrtec allergy  Question: Over the counter medications "other": Answer:     Question: Have any of the over the counter  medications been effective? Answer:   Yes  Question: Do you use a humidifier? Answer:   No  Question: Are you or do you think you might be pregnant? Answer:   No  Question: Are you breastfeeding? Answer:   No  Question: Please list your medication allergies that you may have ? (If 'none' , please list as 'none') Answer:   Zincoxide skin allergies  Question: Please list any additional comments  Answer:   Mainly the ear pressure is what is bothering me  A total of 5-10 minutes was spent evaluating this patients questionnaire and formulating a plan of care.

## 2018-12-19 ENCOUNTER — Encounter (INDEPENDENT_AMBULATORY_CARE_PROVIDER_SITE_OTHER): Payer: Self-pay | Admitting: Family Medicine

## 2018-12-19 ENCOUNTER — Ambulatory Visit (INDEPENDENT_AMBULATORY_CARE_PROVIDER_SITE_OTHER): Payer: 59 | Admitting: Family Medicine

## 2018-12-19 ENCOUNTER — Other Ambulatory Visit: Payer: Self-pay

## 2018-12-19 DIAGNOSIS — R7303 Prediabetes: Secondary | ICD-10-CM

## 2018-12-19 DIAGNOSIS — E559 Vitamin D deficiency, unspecified: Secondary | ICD-10-CM

## 2018-12-19 DIAGNOSIS — Z6841 Body Mass Index (BMI) 40.0 and over, adult: Secondary | ICD-10-CM

## 2018-12-19 DIAGNOSIS — E66813 Obesity, class 3: Secondary | ICD-10-CM

## 2018-12-19 NOTE — Progress Notes (Signed)
Office: [phone]  /  Fax: [phone] TeleHealth Visit:  Drucilla SchmidtCaitlin Diaz has verbally consented to this TeleHealth visit today. The patient is located at home, the provider is located at the [facility] office. The participants in this visit include the listed provider and patient. The visit was conducted today via webex.  HPI:   Chief Complaint: OBESITY [name] is here to discuss her progress with her obesity treatment plan. She is on the Category 3 plan and is following her eating plan approximately 50 % of the time. She states she is exercising 0 minutes 0 times per week. [name] passed her LCSW exam yesterday. She is an emotional eater secondary to anxiety, so she has struggled to eat much of anything. She plans to recommit to Category 3 plan. She has been able to easily find food on the meal plan.  We were unable to weigh the patient today for this TeleHealth visit. She feels as if she has maintained her weight since her last visit. She has lost 1 lb since starting treatment with us.  Pre-Diabetes [name] has a diagnosis of pre-diabetes based on her elevated Hgb A1c and was informed this puts her at greater risk of developing diabetes. She notes carbohydrate cravings especially with increase in anxiety, secondary to studying. She is not taking metformin currently and continues to work on diet and exercise to decrease risk of diabetes. She denies nausea or hypoglycemia.  Vitamin D Deficiency [name] has a diagnosis of vitamin D deficiency. She is currently taking prescription Vit D. She notes fatigue and denies nausea, vomiting or muscle weakness.  ASSESSMENT AND PLAN:  Prediabetes  Vitamin D deficiency  Class 3 severe obesity with serious comorbidity and body mass index (BMI) of 45.0 to 49.9 in adult, unspecified obesity type [facility](HCC)  PLAN:  Pre-Diabetes [name] will continue to work on weight loss, exercise, and decreasing simple carbohydrates in her diet to  help decrease the risk of diabetes. We dicussed metformin including benefits and risks. She was informed that eating too many simple carbohydrates or too many calories at one sitting increases the likelihood of GI side effects. Linh declined metformin for now and a prescription was not written today. We will repeat labs in early June. [name] agrees to follow up with our clinic in 2 weeks as directed to monitor her progress.  Vitamin D Deficiency [name] was informed that low vitamin D levels contributes to fatigue and are associated with obesity, breast, and colon cancer. [name] agrees to continue taking prescription Vit D @50 ,000 IU every week, no refill needed. She will follow up for routine testing of vitamin D, at least 2-3 times per year. She was informed of the risk of over-replacement of vitamin D and agrees to not increase her dose unless she discusses this with us first. [name] agrees to follow up with our clinic in 2 weeks.  Obesity [name] is currently in the action stage of change. As such, her goal is to continue with weight loss efforts She has agreed to follow the Category 3 plan [name] has been instructed to work up to a goal of 150 minutes of combined cardio and strengthening exercise per week or start physical activity for 15-30 minutes 3-4 times per week for weight loss and overall health benefits. We discussed the following Behavioral Modification Strategies today: increasing lean protein intake, increasing vegetables and work on meal planning and easy cooking plans, keeping healthy foods in the home, and planning for success   [name] has  agreed to follow up with our clinic in 2 weeks. She was informed of the importance of frequent follow up visits to maximize her success with intensive lifestyle modifications for her multiple health conditions.  ALLERGIES: Allergies  Allergen Reactions  . Codeine     insomnia   . Lactose Intolerance (Gi) Diarrhea  . Zinc Oxide Rash     MEDICATIONS: Current Outpatient Medications on File Prior to Visit  Medication Sig Dispense Refill  . acetaminophen (TYLENOL) 325 MG tablet Take 650 mg by mouth every 6 (six) hours as needed.    . cetirizine (ZYRTEC) 10 MG tablet Take 10 mg by mouth daily.    . fluticasone (FLONASE) 50 MCG/ACT nasal spray Place 2 sprays into both nostrils daily. 16 g 6  . Melatonin 5 MG TABS Take 1 tablet by mouth at bedtime.    . montelukast (SINGULAIR) 10 MG tablet Take 10 mg by mouth at bedtime.    . Vitamin D, Ergocalciferol, (DRISDOL) 1.25 MG (50000 UT) CAPS capsule Take 1 capsule (50,000 Units total) by mouth every 7 (seven) days. 4 capsule 0   No current facility-administered medications on file prior to visit.     PAST MEDICAL HISTORY: Past Medical History:  Diagnosis Date  . Anxiety   . Back pain   . Depression   . Environmental allergies   . Fatigue   . Lactose intolerance   . Migraines   . Prediabetes   . Vitamin D deficiency     PAST SURGICAL HISTORY: Past Surgical History:  Procedure Laterality Date  . APPENDECTOMY    . CYSTECTOMY    . TONSILLECTOMY AND ADENOIDECTOMY      SOCIAL HISTORY: Social History   Tobacco Use  . Smoking status: Never Smoker  . Smokeless tobacco: Never Used  Substance Use Topics  . Alcohol use: Yes    Comment: socially  . Drug use: Never    FAMILY HISTORY: Family History  Problem Relation Age of Onset  . Diabetes Father   . Hypertension Father   . Heart disease Father   . Depression Father   . Anxiety disorder Father   . Bipolar disorder Father   . Obesity Father     ROS: Review of Systems  Constitutional: Positive for malaise/fatigue. Negative for weight loss.  Gastrointestinal: Negative for nausea and vomiting.  Musculoskeletal:       Negative muscle weakness  Endo/Heme/Allergies:       Negative hypoglycemia    PHYSICAL EXAM: Pt in no acute distress  RECENT LABS AND TESTS: BMET    Component Value Date/Time   NA 139  01/09/2012 2210   K 3.9 01/09/2012 2210   CL 103 01/09/2012 2210   CO2 26 01/09/2012 2210   GLUCOSE 88 01/09/2012 2210   BUN 11 01/09/2012 2210   CREATININE 0.66 01/09/2012 2210   CALCIUM 9.7 01/09/2012 2210   GFRNONAA >90 01/09/2012 2210   GFRAA >90 01/09/2012 2210   No results found for: HGBA1C No results found for: INSULIN CBC    Component Value Date/Time   WBC 14.1 (H) 01/09/2012 2210   RBC 5.48 (H) 01/09/2012 2210   HGB 11.3 (L) 01/09/2012 2210   HCT 37.1 01/09/2012 2210   PLT 410 (H) 01/09/2012 2210   MCV 67.7 (L) 01/09/2012 2210   MCH 20.6 (L) 01/09/2012 2210   MCHC 30.5 01/09/2012 2210   RDW 17.9 (H) 01/09/2012 2210   LYMPHSABS 5.1 (H) 01/09/2012 2210   MONOABS 0.6 01/09/2012 2210   EOSABS  0.1 01/09/2012 2210   BASOSABS 0.1 01/09/2012 2210   Iron/TIBC/Ferritin/ %Sat No results found for: IRON, TIBC, FERRITIN, IRONPCTSAT Lipid Panel  No results found for: CHOL, TRIG, HDL, CHOLHDL, VLDL, LDLCALC, LDLDIRECT Hepatic Function Panel     Component Value Date/Time   PROT 7.8 01/09/2012 2210   ALBUMIN 4.0 01/09/2012 2210   AST 23 01/09/2012 2210   ALT 31 01/09/2012 2210   ALKPHOS 55 01/09/2012 2210   BILITOT 0.2 (L) 01/09/2012 2210      Component Value Date/Time   TSH 4.090 09/27/2018 1153      I, Burt Knack, am acting as transcriptionist for Debbra Riding, MD  I have reviewed the above documentation for accuracy and completeness, and I agree with the above. - Debbra Riding, MD

## 2018-12-20 DIAGNOSIS — J3089 Other allergic rhinitis: Secondary | ICD-10-CM | POA: Diagnosis not present

## 2018-12-20 DIAGNOSIS — J301 Allergic rhinitis due to pollen: Secondary | ICD-10-CM | POA: Diagnosis not present

## 2018-12-26 ENCOUNTER — Other Ambulatory Visit (INDEPENDENT_AMBULATORY_CARE_PROVIDER_SITE_OTHER): Payer: Self-pay | Admitting: Family Medicine

## 2018-12-26 DIAGNOSIS — E559 Vitamin D deficiency, unspecified: Secondary | ICD-10-CM

## 2018-12-27 DIAGNOSIS — J3089 Other allergic rhinitis: Secondary | ICD-10-CM | POA: Diagnosis not present

## 2018-12-27 DIAGNOSIS — J301 Allergic rhinitis due to pollen: Secondary | ICD-10-CM | POA: Diagnosis not present

## 2019-01-03 ENCOUNTER — Ambulatory Visit (INDEPENDENT_AMBULATORY_CARE_PROVIDER_SITE_OTHER): Payer: 59 | Admitting: Family Medicine

## 2019-01-03 ENCOUNTER — Other Ambulatory Visit: Payer: Self-pay

## 2019-01-03 ENCOUNTER — Encounter (INDEPENDENT_AMBULATORY_CARE_PROVIDER_SITE_OTHER): Payer: Self-pay | Admitting: Family Medicine

## 2019-01-03 DIAGNOSIS — E559 Vitamin D deficiency, unspecified: Secondary | ICD-10-CM

## 2019-01-03 DIAGNOSIS — J3089 Other allergic rhinitis: Secondary | ICD-10-CM | POA: Diagnosis not present

## 2019-01-03 DIAGNOSIS — Z6841 Body Mass Index (BMI) 40.0 and over, adult: Secondary | ICD-10-CM | POA: Diagnosis not present

## 2019-01-03 DIAGNOSIS — R7303 Prediabetes: Secondary | ICD-10-CM

## 2019-01-03 DIAGNOSIS — J301 Allergic rhinitis due to pollen: Secondary | ICD-10-CM | POA: Diagnosis not present

## 2019-01-03 MED ORDER — VITAMIN D (ERGOCALCIFEROL) 1.25 MG (50000 UNIT) PO CAPS: 50000.0000 [IU] | ORAL_CAPSULE | ORAL | 0 refills | Status: DC

## 2019-01-03 NOTE — Progress Notes (Signed)
Office: [phone]  /  Fax: [phone] TeleHealth Visit:  Tamara Diaz has verbally consented to this TeleHealth visit today. The patient is located at home, the provider is located at the [facility] office. The participants in this visit include the listed provider and patient and any and all parties involved. The visit was conducted today via WebEx.  HPI:   Chief Complaint: OBESITY Tamara Diaz is here to discuss her progress with her obesity treatment plan. She is on the Category 3 plan and is following her eating plan approximately 100 % of the time. She states she is doing workouts at home 15 minutes 2 times per week. [name] voices the past two weeks, she has been much more on track. She has been planning ahead and creating a grocery list to help make sure food is in the house. She has been trying to get in around two workouts per week. We were unable to weigh the patient today for this TeleHealth visit. She feels as if she has lost weight since her last visit. She has lost 3 to 4 lbs since starting treatment with [name].  Vitamin D deficiency Tamara Diaz has a diagnosis of vitamin D deficiency. Mykyla is currently taking vit D. She admits fatigue and denies nausea, vomiting or muscle weakness.  Pre-Diabetes Tamara Diaz has a diagnosis of prediabetes based on her elevated Hgb A1c and was informed this puts her at greater risk of developing diabetes. She is not taking metformin currently and continues to work on diet and exercise to decrease risk of diabetes. She admits to occasional carb craving, but she is staying within her snack calories.  ASSESSMENT AND PLAN:  Vitamin D deficiency - Plan: Vitamin D, Ergocalciferol, (DRISDOL) 1.25 MG (50000 UT) CAPS capsule  Prediabetes  Class 3 severe obesity with serious comorbidity and body mass index (BMI) of 45.0 to 49.9 in adult, unspecified obesity type (HCC)  PLAN:  Vitamin D Deficiency [employer] was informed that low vitamin D  levels contributes to fatigue and are associated with obesity, breast, and colon cancer. She agrees to continue to take prescription Vit D @50 ,000 IU every week #4 with no refills and will follow up for routine testing of vitamin D, at least 2-3 times per year. She was informed of the risk of over-replacement of vitamin D and agrees to not increase her dose unless she discusses this with [name] first. Tamara Diaz Parody agree to follow up as directed.  Pre-Diabetes [name] will continue to work on weight loss, exercise, and decreasing simple carbohydrates in her diet to help decrease the risk of diabetes. She was informed that eating too many simple carbohydrates or too many calories at one sitting increases the likelihood of GI side effects. [name] agreed to follow up with [name] as directed to monitor her progress. We will repeat labs at the next in person appointment.  Obesity Tamara Diaz is currently in the action stage of change. As such, her goal is to continue with weight loss efforts She has agreed to follow the Category 3 plan Tamara Diaz has been instructed to work up to a goal of 150 minutes of combined cardio and strengthening exercise per week for weight loss and overall health benefits. We discussed the following Behavioral Modification Strategies today: planning for success, keeping healthy foods in the home, better snacking choices, increasing lean protein intake, increasing vegetables and work on meal planning and easy cooking plans  Tamara Diaz has agreed to follow up with our clinic in 2 weeks. She was informed of  the importance of frequent follow up visits to maximize her success with intensive lifestyle modifications for her multiple health conditions.  ALLERGIES: Allergies  Allergen Reactions  . Codeine     insomnia   . Lactose Intolerance (Gi) Diarrhea  . Zinc Oxide Rash    MEDICATIONS: Current Outpatient Medications on File Prior to Visit  Medication Sig Dispense Refill  . acetaminophen (TYLENOL)  325 MG tablet Take 650 mg by mouth every 6 (six) hours as needed.    . cetirizine (ZYRTEC) 10 MG tablet Take 10 mg by mouth daily.    . fluticasone (FLONASE) 50 MCG/ACT nasal spray Place 2 sprays into both nostrils daily. 16 g 6  . Melatonin 5 MG TABS Take 1 tablet by mouth at bedtime.    . montelukast (SINGULAIR) 10 MG tablet Take 10 mg by mouth at bedtime.     No current facility-administered medications on file prior to visit.     PAST MEDICAL HISTORY: Past Medical History:  Diagnosis Date  . Anxiety   . Back pain   . Depression   . Environmental allergies   . Fatigue   . Lactose intolerance   . Migraines   . Prediabetes   . Vitamin D deficiency     PAST SURGICAL HISTORY: Past Surgical History:  Procedure Laterality Date  . APPENDECTOMY    . CYSTECTOMY    . TONSILLECTOMY AND ADENOIDECTOMY      SOCIAL HISTORY: Social History   Tobacco Use  . Smoking status: Never Smoker  . Smokeless tobacco: Never Used  Substance Use Topics  . Alcohol use: Yes    Comment: socially  . Drug use: Never    FAMILY HISTORY: Family History  Problem Relation Age of Onset  . Diabetes Father   . Hypertension Father   . Heart disease Father   . Depression Father   . Anxiety disorder Father   . Bipolar disorder Father   . Obesity Father     ROS: Review of Systems  Constitutional: Positive for malaise/fatigue and weight loss.  Gastrointestinal: Negative for nausea and vomiting.  Musculoskeletal:       Negative for muscle weakness    PHYSICAL EXAM: Pt in no acute distress  RECENT LABS AND TESTS: BMET    Component Value Date/Time   NA 139 01/09/2012 2210   K 3.9 01/09/2012 2210   CL 103 01/09/2012 2210   CO2 26 01/09/2012 2210   GLUCOSE 88 01/09/2012 2210   BUN 11 01/09/2012 2210   CREATININE 0.66 01/09/2012 2210   CALCIUM 9.7 01/09/2012 2210   GFRNONAA >90 01/09/2012 2210   GFRAA >90 01/09/2012 2210   No results found for: HGBA1C No results found for: INSULIN CBC     Component Value Date/Time   WBC 14.1 (H) 01/09/2012 2210   RBC 5.48 (H) 01/09/2012 2210   HGB 11.3 (L) 01/09/2012 2210   HCT 37.1 01/09/2012 2210   PLT 410 (H) 01/09/2012 2210   MCV 67.7 (L) 01/09/2012 2210   MCH 20.6 (L) 01/09/2012 2210   MCHC 30.5 01/09/2012 2210   RDW 17.9 (H) 01/09/2012 2210   LYMPHSABS 5.1 (H) 01/09/2012 2210   MONOABS 0.6 01/09/2012 2210   EOSABS 0.1 01/09/2012 2210   BASOSABS 0.1 01/09/2012 2210   Iron/TIBC/Ferritin/ %Sat No results found for: IRON, TIBC, FERRITIN, IRONPCTSAT Lipid Panel  No results found for: CHOL, TRIG, HDL, CHOLHDL, VLDL, LDLCALC, LDLDIRECT Hepatic Function Panel     Component Value Date/Time   PROT 7.8 01/09/2012 2210  ALBUMIN 4.0 01/09/2012 2210   AST 23 01/09/2012 2210   ALT 31 01/09/2012 2210   ALKPHOS 55 01/09/2012 2210   BILITOT 0.2 (L) 01/09/2012 2210      Component Value Date/Time   TSH 4.090 09/27/2018 1153     I, Nevada Crane, am acting as transcriptionist for Filbert Schilder, MD  I have reviewed the above documentation for accuracy and completeness, and I agree with the above. - Debbra Riding, MD

## 2019-01-08 ENCOUNTER — Telehealth: Payer: Self-pay | Admitting: Family

## 2019-01-08 DIAGNOSIS — B9789 Other viral agents as the cause of diseases classified elsewhere: Secondary | ICD-10-CM

## 2019-01-08 DIAGNOSIS — J019 Acute sinusitis, unspecified: Secondary | ICD-10-CM

## 2019-01-08 MED ORDER — PREDNISONE 10 MG (21) PO TBPK: ORAL_TABLET | ORAL | 0 refills | Status: DC

## 2019-01-08 NOTE — Progress Notes (Signed)
We are sorry that you are not feeling well.  Here is how we plan to help!  Based on what you have shared with me it looks like you have sinusitis.  Sinusitis is inflammation and infection in the sinus cavities of the head.  Based on your presentation I believe you most likely have Acute Viral Sinusitis.This is an infection most likely caused by a virus. There is not specific treatment for viral sinusitis other than to help you with the symptoms until the infection runs its course.  You may use an oral decongestant such as Mucinex D or if you have glaucoma or high blood pressure use plain Mucinex. Saline nasal spray help and can safely be used as often as needed for congestion, I have prescribed: Prednisone dosepak that II have sent to the pharmacy  Some authorities believe that zinc sprays or the use of Echinacea may shorten the course of your symptoms.  Sinus infections are not as easily transmitted as other respiratory infection, however we still recommend that you avoid close contact with loved ones, especially the very young and elderly.  Remember to wash your hands thoroughly throughout the day as this is the number one way to prevent the spread of infection!  Home Care:  Only take medications as instructed by your medical team.  Do not take these medications with alcohol.  A steam or ultrasonic humidifier can help congestion.  You can place a towel over your head and breathe in the steam from hot water coming from a faucet.  Avoid close contacts especially the very young and the elderly.  Cover your mouth when you cough or sneeze.  Always remember to wash your hands.  Get Help Right Away If:  You develop worsening fever or sinus pain.  You develop a severe head ache or visual changes.  Your symptoms persist after you have completed your treatment plan.  Make sure you  Understand these instructions.  Will watch your condition.  Will get help right away if you are not doing  well or get worse.  Your e-visit answers were reviewed by a board certified advanced clinical practitioner to complete your personal care plan.  Depending on the condition, your plan could have included both over the counter or prescription medications.  If there is a problem please reply  once you have received a response from your provider.  Your safety is important to [name].  If you have drug allergies check your prescription carefully.    You can use MyChart to ask questions about today's visit, request a non-urgent call back, or ask for a work or school excuse for 24 hours related to this e-Visit. If it has been greater than 24 hours you will need to follow up with your provider, or enter a new e-Visit to address those concerns.  You will get an e-mail in the next two days asking about your experience.  I hope that your e-visit has been valuable and will speed your recovery. Thank you for using e-visits.  Greater than 5 minutes, yet less than 10 minutes of time have been spent researching, coordinating, and implementing care for this patient today.  Thank you for the details you included in the comment boxes. Those details are very helpful in determining the best course of treatment for you and help [name] to provide the best care.

## 2019-01-10 DIAGNOSIS — J3089 Other allergic rhinitis: Secondary | ICD-10-CM | POA: Diagnosis not present

## 2019-01-10 DIAGNOSIS — J301 Allergic rhinitis due to pollen: Secondary | ICD-10-CM | POA: Diagnosis not present

## 2019-01-13 ENCOUNTER — Telehealth: Payer: 59 | Admitting: Physician Assistant

## 2019-01-13 DIAGNOSIS — J019 Acute sinusitis, unspecified: Secondary | ICD-10-CM | POA: Diagnosis not present

## 2019-01-13 DIAGNOSIS — B9689 Other specified bacterial agents as the cause of diseases classified elsewhere: Secondary | ICD-10-CM

## 2019-01-13 MED ORDER — AMOXICILLIN-POT CLAVULANATE 875-125 MG PO TABS: 1.0000 | ORAL_TABLET | Freq: Two times a day (BID) | ORAL | 0 refills | Status: AC

## 2019-01-13 NOTE — Progress Notes (Signed)
We are sorry that you are not feeling well.  Here is how we plan to help!  Based on what you have shared with me it looks like you have sinusitis.  Sinusitis is inflammation and infection in the sinus cavities of the head.  Based on your presentation I believe you most likely have Acute Bacterial Sinusitis.  This is an infection caused by bacteria and is treated with antibiotics. I have prescribed Augmentin 875mg /125mg  one tablet twice daily with food, for 7 days. You may use an oral decongestant such as Mucinex D or if you have glaucoma or high blood pressure use plain Mucinex. Saline nasal spray help and can safely be used as often as needed for congestion.  If you develop worsening sinus pain, fever or notice severe headache and vision changes, or if symptoms are not better after completion of antibiotic, please schedule an appointment with a health care provider.    Sinus infections are not as easily transmitted as other respiratory infection, however we still recommend that you avoid close contact with loved ones, especially the very young and elderly.  Remember to wash your hands thoroughly throughout the day as this is the number one way to prevent the spread of infection!  Home Care: Only take medications as instructed by your medical team. Complete the entire course of an antibiotic. Do not take these medications with alcohol. A steam or ultrasonic humidifier can help congestion.  You can place a towel over your head and breathe in the steam from hot water coming from a faucet. Avoid close contacts especially the very young and the elderly. Cover your mouth when you cough or sneeze. Always remember to wash your hands.  Get Help Right Away If: You develop worsening fever or sinus pain. You develop a severe head ache or visual changes. Your symptoms persist after you have completed your treatment plan.  Make sure you Understand these instructions. Will watch your condition. Will get  help right away if you are not doing well or get worse.  Your e-visit answers were reviewed by a board certified advanced clinical practitioner to complete your personal care plan.  Depending on the condition, your plan could have included both over the counter or prescription medications.  If there is a problem please reply once you have received a response from your provider.  Your safety is important to [name].  If you have drug allergies check your prescription carefully.    You can use MyChart to ask questions about today's visit, request a non-urgent call back, or ask for a work or school excuse for 24 hours related to this e-Visit. If it has been greater than 24 hours you will need to follow up with your provider, or enter a new e-Visit to address those concerns.  You will get an e-mail in the next two days asking about your experience.  I hope that your e-visit has been valuable and will speed your recovery. Thank you for using e-visits.    ===View-only below this line===   ----- Message -----    From: Tamara Diaz    Sent: 01/13/2019  1:07 PM EDT      To: E-Visit Mailing List Subject: E-Visit Submission: Sinus Problems  E-Visit Submission: Sinus Problems --------------------------------  Question: Which of the following have you been experiencing? Answer:   Congested nose            Pain around the nose and face  Headache            Cough  Question: Have these symptoms significantly worsened over the last two to three days? Answer:   Yes  Question: Have you had any of the following? Answer:   None of the above  Question: How long have you been having these symptoms? Answer:   4 days  Question: Do you have a fever? Answer:   No, I do not have a fever  Question: Do you smoke? Answer:   No  Question: Have you ever smoked? Answer:   I have never smoked  Question: Do you have any chronic illnesses, such as diabetes, heart disease, kidney disease, or lung  disease, or any illness that would weaken your body's ability to fight infection? Answer:   No  Question: When you blow your nose, what color is the mucus? Answer:   Mostly thin and yellow or green  Question: Have you experienced similar problems in the past? Answer:   Yes  Question: What treatments have worked in the past?  Answer:   Antibiotics  Question: What treatment(s) in the past have been unsuccessful? Answer:     Question: Is this illness similar to previous illnesses you have had?  How is it the same?  How is it different? Answer:   Feels like I have a sinus infection. I was treated last week but I am not feeling any better  Question: Have you recently been hospitalized? Answer:   No  Question: What medications are you currently taking for these symptoms? Answer:   Decongestants            Nose spray  Question: Please enter the names of any medications you are taking, or any other treatments you are trying. Answer:   predinsone  Question: Are you pregnant? Answer:   I am confident that I am not pregnant  Question: Are you breastfeeding? Answer:   No  Question: Please list your medication allergies that you may have ? (If 'none' , please list as 'none') Answer:   zincoxide  Question: Please list any additional comments  Answer:     A total of 5-10 minutes was spent evaluating this patients questionnaire and formulating a plan of care.

## 2019-01-15 ENCOUNTER — Encounter (INDEPENDENT_AMBULATORY_CARE_PROVIDER_SITE_OTHER): Payer: Self-pay

## 2019-01-17 ENCOUNTER — Encounter (INDEPENDENT_AMBULATORY_CARE_PROVIDER_SITE_OTHER): Payer: Self-pay | Admitting: Family Medicine

## 2019-01-17 ENCOUNTER — Other Ambulatory Visit: Payer: Self-pay

## 2019-01-17 ENCOUNTER — Ambulatory Visit (INDEPENDENT_AMBULATORY_CARE_PROVIDER_SITE_OTHER): Payer: 59 | Admitting: Family Medicine

## 2019-01-17 DIAGNOSIS — Z6841 Body Mass Index (BMI) 40.0 and over, adult: Secondary | ICD-10-CM | POA: Diagnosis not present

## 2019-01-17 DIAGNOSIS — R7303 Prediabetes: Secondary | ICD-10-CM | POA: Diagnosis not present

## 2019-01-17 DIAGNOSIS — E559 Vitamin D deficiency, unspecified: Secondary | ICD-10-CM | POA: Diagnosis not present

## 2019-01-18 ENCOUNTER — Telehealth: Payer: 59 | Admitting: Family

## 2019-01-18 DIAGNOSIS — R05 Cough: Secondary | ICD-10-CM | POA: Diagnosis not present

## 2019-01-18 DIAGNOSIS — R059 Cough, unspecified: Secondary | ICD-10-CM

## 2019-01-18 MED ORDER — BENZONATATE 100 MG PO CAPS: 100.0000 mg | ORAL_CAPSULE | Freq: Three times a day (TID) | ORAL | 0 refills | Status: DC | PRN

## 2019-01-18 MED ORDER — ALBUTEROL SULFATE HFA 108 (90 BASE) MCG/ACT IN AERS: 2.0000 | INHALATION_SPRAY | RESPIRATORY_TRACT | 0 refills | Status: AC | PRN

## 2019-01-18 NOTE — Progress Notes (Signed)
Greater than 5 minutes, yet less than 10 minutes of time have been spent researching, coordinating, and implementing care for this patient today.  Thank you for the details you included in the comment boxes. Those details are very helpful in determining the best course of treatment for you and help us to provide the best care.  Please continue the Augmentin for the full course. See plan below for the coughing:  We are sorry that you are not feeling well.  Here is how we plan to help!  Based on your presentation I believe you most likely have A cough due to bacteria.  When patients have a fever and a productive cough with a change in color or increased sputum production, we are concerned about bacterial bronchitis.  If left untreated it can progress to pneumonia.  If your symptoms do not improve with your treatment plan it is important that you contact your provider.   In addition you may use A non-prescription cough medication called Mucinex DM: take 2 tablets every 12 hours. and A prescription cough medication called Tessalon Perles 100mg . You may take 1-2 capsules every 8 hours as needed for your cough.  I have also added an Albuterol inhaler, take 2 puffs every 6 hours as needed for shortness of breath.    From your responses in the eVisit questionnaire you describe inflammation in the upper respiratory tract which is causing a significant cough.  This is commonly called Bronchitis and has four common causes:    Allergies  Viral Infections  Acid Reflux  Bacterial Infection Allergies, viruses and acid reflux are treated by controlling symptoms or eliminating the cause. An example might be a cough caused by taking certain blood pressure medications. You stop the cough by changing the medication. Another example might be a cough caused by acid reflux. Controlling the reflux helps control the cough.  USE OF BRONCHODILATOR ("RESCUE") INHALERS: There is a risk from using your bronchodilator  too frequently.  The risk is that over-reliance on a medication which only relaxes the muscles surrounding the breathing tubes can reduce the effectiveness of medications prescribed to reduce swelling and congestion of the tubes themselves.  Although you feel brief relief from the bronchodilator inhaler, your asthma may actually be worsening with the tubes becoming more swollen and filled with mucus.  This can delay other crucial treatments, such as oral steroid medications. If you need to use a bronchodilator inhaler daily, several times per day, you should discuss this with your provider.  There are probably better treatments that could be used to keep your asthma under control.     HOME CARE . Only take medications as instructed by your medical team. . Complete the entire course of an antibiotic. . Drink plenty of fluids and get plenty of rest. . Avoid close contacts especially the very young and the elderly . Cover your mouth if you cough or cough into your sleeve. . Always remember to wash your hands . A steam or ultrasonic humidifier can help congestion.   GET HELP RIGHT AWAY IF: . You develop worsening fever. . You become short of breath . You cough up blood. . Your symptoms persist after you have completed your treatment plan MAKE SURE YOU   Understand these instructions.  Will watch your condition.  Will get help right away if you are not doing well or get worse.  Your e-visit answers were reviewed by a board certified advanced clinical practitioner to complete your personal care plan.  Depending on the condition, your plan could have included both over the counter or prescription medications. If there is a problem please reply  once you have received a response from your provider. Your safety is important to [name].  If you have drug allergies check your prescription carefully.    You can use MyChart to ask questions about today's visit, request a non-urgent call back, or ask for a  work or school excuse for 24 hours related to this e-Visit. If it has been greater than 24 hours you will need to follow up with your provider, or enter a new e-Visit to address those concerns. You will get an e-mail in the next two days asking about your experience.  I hope that your e-visit has been valuable and will speed your recovery. Thank you for using e-visits.

## 2019-01-21 NOTE — Progress Notes (Signed)
Office: [phone]  /  Fax: [phone] TeleHealth Visit:  Tamara Diaz has verbally consented to this TeleHealth visit today. The patient is located at home, the provider is located at the [employer] [facility] office. The participants in this visit include the listed provider and patient. The visit was conducted today via webex.  HPI:   Chief Complaint: OBESITY Tamara Diaz is here to discuss her progress with her obesity treatment plan. She is on the Category 3 plan and is following her eating plan approximately 0 % of the time. She states she is exercising 0 minutes 0 times per week. Tamara Diaz is currently dealing with a sinus infection and is on day 4 of antibiotics. 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 She is tolerating mostly soups and popsicles.  We were unable to weigh the patient today for this TeleHealth visit. She feels as if she has lost 1-2 lbs since her last visit. She has lost 3-4 lbs since starting treatment with [name].  Pre-Diabetes Tamara Diaz has a diagnosis of pre-diabetes based on her elevated Hgb A1c and was informed this puts her at greater risk of developing diabetes. She notes minimal carbohydrate cravings currently. She is not taking metformin currently and continues to work on diet and exercise to decrease risk of diabetes. She denies nausea or hypoglycemia.  Vitamin D Deficiency Tamara Diaz has a diagnosis of vitamin D deficiency. She is currently taking prescription Vit D. She notes fatigue and denies nausea, vomiting or muscle weakness.  ASSESSMENT AND PLAN:  Prediabetes  Vitamin D deficiency  Class 3 severe obesity with serious comorbidity and body mass index (BMI) of 45.0 to 49.9 in adult, unspecified obesity type [facility])  PLAN:  Pre-Diabetes Kylie will continue to work on weight loss, exercise, and decreasing simple carbohydrates in her diet to help decrease the risk of diabetes. We dicussed metformin including benefits  and risks. She was informed that eating too many simple carbohydrates or too many calories at one sitting increases the likelihood of GI side effects. Deneise declined metformin for now and a prescription was not written today. We will repeat labs at her next appointment. Kharter agrees to follow up with our clinic in 2 weeks as directed to monitor her progress.  Vitamin D Deficiency Tamara Diaz was informed that low vitamin D levels contributes to fatigue and are associated with obesity, breast, and colon cancer. Tamara Diaz agrees to continue taking prescription Vit D @50 ,000 IU every week and will follow up for routine testing of vitamin D, at least 2-3 times per year. She was informed of the risk of over-replacement of vitamin D and agrees to not increase her dose unless she discusses this with [name] first. We will repeat labs at her next appointment. Tamara Diaz agrees to follow up with our clinic in 2 weeks.  Obesity Tamara Diaz is currently in the action stage of change. As such, her goal is to continue with weight loss efforts She has agreed to follow the Category 3 plan Tamara Diaz has been instructed to work up to a goal of 150 minutes of combined cardio and strengthening exercise per week for weight loss and overall health benefits. We discussed the following Behavioral Modification Strategies today: increasing lean protein intake, increasing vegetables and work on meal planning and easy cooking plans, keeping healthy foods in the home, better snacking choices, and planning for success   Tamara Diaz has agreed to follow up with our clinic in 2 weeks. She was informed of the  importance of frequent follow up visits to maximize her success with intensive lifestyle modifications for her multiple health conditions.  ALLERGIES: Allergies  Allergen Reactions  . Codeine     insomnia   . Lactose Intolerance (Gi) Diarrhea  . Zinc Oxide Rash    MEDICATIONS: Current Outpatient Medications on File Prior to Visit   Medication Sig Dispense Refill  . acetaminophen (TYLENOL) 325 MG tablet Take 650 mg by mouth every 6 (six) hours as needed.    Marland Kitchen. amoxicillin-clavulanate (AUGMENTIN) 875-125 MG tablet Take 1 tablet by mouth 2 (two) times daily for 10 days. Take with food. 20 tablet 0  . cetirizine (ZYRTEC) 10 MG tablet Take 10 mg by mouth daily.    . fluticasone (FLONASE) 50 MCG/ACT nasal spray Place 2 sprays into both nostrils daily. 16 g 6  . Melatonin 5 MG TABS Take 1 tablet by mouth at bedtime.    . montelukast (SINGULAIR) 10 MG tablet Take 10 mg by mouth at bedtime.    . predniSONE (STERAPRED UNI-PAK 21 TAB) 10 MG (21) TBPK tablet As directed 21 tablet 0  . Vitamin D, Ergocalciferol, (DRISDOL) 1.25 MG (50000 UT) CAPS capsule Take 1 capsule (50,000 Units total) by mouth every 7 (seven) days. 4 capsule 0   No current facility-administered medications on file prior to visit.     PAST MEDICAL HISTORY: Past Medical History:  Diagnosis Date  . Anxiety   . Back pain   . Depression   . Environmental allergies   . Fatigue   . Lactose intolerance   . Migraines   . Prediabetes   . Vitamin D deficiency     PAST SURGICAL HISTORY: Past Surgical History:  Procedure Laterality Date  . APPENDECTOMY    . CYSTECTOMY    . TONSILLECTOMY AND ADENOIDECTOMY      SOCIAL HISTORY: Social History   Tobacco Use  . Smoking status: Never Smoker  . Smokeless tobacco: Never Used  Substance Use Topics  . Alcohol use: Yes    Comment: socially  . Drug use: Never    FAMILY HISTORY: Family History  Problem Relation Age of Onset  . Diabetes Father   . Hypertension Father   . Heart disease Father   . Depression Father   . Anxiety disorder Father   . Bipolar disorder Father   . Obesity Father     ROS: Review of Systems  Constitutional: Positive for malaise/fatigue and weight loss.  Gastrointestinal: Negative for nausea and vomiting.  Musculoskeletal:       Negative muscle weakness  Endo/Heme/Allergies:        Negative hypoglycemia    PHYSICAL EXAM: Pt in no acute distress  RECENT LABS AND TESTS: BMET    Component Value Date/Time   NA 139 01/09/2012 2210   K 3.9 01/09/2012 2210   CL 103 01/09/2012 2210   CO2 26 01/09/2012 2210   GLUCOSE 88 01/09/2012 2210   BUN 11 01/09/2012 2210   CREATININE 0.66 01/09/2012 2210   CALCIUM 9.7 01/09/2012 2210   GFRNONAA >90 01/09/2012 2210   GFRAA >90 01/09/2012 2210   No results found for: HGBA1C No results found for: INSULIN CBC    Component Value Date/Time   WBC 14.1 (H) 01/09/2012 2210   RBC 5.48 (H) 01/09/2012 2210   HGB 11.3 (L) 01/09/2012 2210   HCT 37.1 01/09/2012 2210   PLT 410 (H) 01/09/2012 2210   MCV 67.7 (L) 01/09/2012 2210   MCH 20.6 (L) 01/09/2012 2210   MCHC  30.5 01/09/2012 2210   RDW 17.9 (H) 01/09/2012 2210   LYMPHSABS 5.1 (H) 01/09/2012 2210   MONOABS 0.6 01/09/2012 2210   EOSABS 0.1 01/09/2012 2210   BASOSABS 0.1 01/09/2012 2210   Iron/TIBC/Ferritin/ %Sat No results found for: IRON, TIBC, FERRITIN, IRONPCTSAT Lipid Panel  No results found for: CHOL, TRIG, HDL, CHOLHDL, VLDL, LDLCALC, LDLDIRECT Hepatic Function Panel     Component Value Date/Time   PROT 7.8 01/09/2012 2210   ALBUMIN 4.0 01/09/2012 2210   AST 23 01/09/2012 2210   ALT 31 01/09/2012 2210   ALKPHOS 55 01/09/2012 2210   BILITOT 0.2 (L) 01/09/2012 2210      Component Value Date/Time   TSH 4.090 09/27/2018 1153      I, Burt KnackSharon Ashby Leflore, am acting as transcriptionist for Debbra RidingAlexandria Kadolph, MD  I have reviewed the above documentation for accuracy and completeness, and I agree with the above. - Debbra RidingAlexandria Kadolph, MD

## 2019-01-24 DIAGNOSIS — J301 Allergic rhinitis due to pollen: Secondary | ICD-10-CM | POA: Diagnosis not present

## 2019-01-24 DIAGNOSIS — J3089 Other allergic rhinitis: Secondary | ICD-10-CM | POA: Diagnosis not present

## 2019-01-25 ENCOUNTER — Encounter (INDEPENDENT_AMBULATORY_CARE_PROVIDER_SITE_OTHER): Payer: Self-pay | Admitting: Family Medicine

## 2019-01-28 ENCOUNTER — Other Ambulatory Visit (INDEPENDENT_AMBULATORY_CARE_PROVIDER_SITE_OTHER): Payer: Self-pay | Admitting: Family Medicine

## 2019-01-28 DIAGNOSIS — E559 Vitamin D deficiency, unspecified: Secondary | ICD-10-CM

## 2019-01-29 ENCOUNTER — Ambulatory Visit (INDEPENDENT_AMBULATORY_CARE_PROVIDER_SITE_OTHER): Payer: 59 | Admitting: Family Medicine

## 2019-01-29 DIAGNOSIS — J301 Allergic rhinitis due to pollen: Secondary | ICD-10-CM | POA: Diagnosis not present

## 2019-01-29 DIAGNOSIS — J3089 Other allergic rhinitis: Secondary | ICD-10-CM | POA: Diagnosis not present

## 2019-02-05 DIAGNOSIS — Z872 Personal history of diseases of the skin and subcutaneous tissue: Secondary | ICD-10-CM | POA: Diagnosis not present

## 2019-02-05 DIAGNOSIS — Z308 Encounter for other contraceptive management: Secondary | ICD-10-CM | POA: Diagnosis not present

## 2019-02-05 DIAGNOSIS — G4700 Insomnia, unspecified: Secondary | ICD-10-CM | POA: Diagnosis not present

## 2019-02-05 DIAGNOSIS — J3089 Other allergic rhinitis: Secondary | ICD-10-CM | POA: Diagnosis not present

## 2019-02-05 DIAGNOSIS — Z6841 Body Mass Index (BMI) 40.0 and over, adult: Secondary | ICD-10-CM | POA: Diagnosis not present

## 2019-02-05 DIAGNOSIS — J301 Allergic rhinitis due to pollen: Secondary | ICD-10-CM | POA: Diagnosis not present

## 2019-02-05 DIAGNOSIS — F331 Major depressive disorder, recurrent, moderate: Secondary | ICD-10-CM | POA: Diagnosis not present

## 2019-02-05 DIAGNOSIS — J309 Allergic rhinitis, unspecified: Secondary | ICD-10-CM | POA: Diagnosis not present

## 2019-02-05 DIAGNOSIS — F419 Anxiety disorder, unspecified: Secondary | ICD-10-CM | POA: Diagnosis not present

## 2019-02-28 ENCOUNTER — Telehealth: Payer: 59 | Admitting: Physician Assistant

## 2019-02-28 DIAGNOSIS — J302 Other seasonal allergic rhinitis: Secondary | ICD-10-CM

## 2019-02-28 MED ORDER — IPRATROPIUM BROMIDE 0.03 % NA SOLN: 2.0000 | Freq: Two times a day (BID) | NASAL | 0 refills | Status: DC

## 2019-02-28 NOTE — Progress Notes (Signed)
E visit for Allergic Rhinitis We are sorry that you are not feeling well.  Here is how we plan to help!  Based on what you have shared with me it looks like you have Allergic Rhinitis.  Rhinitis is when a reaction occurs that causes nasal congestion, runny nose, sneezing, and itching.  Most types of rhinitis are caused by an inflammation and are associated with symptoms in the eyes ears or throat. There are several types of rhinitis.  The most common are acute rhinitis, which is usually caused by a viral illness, allergic or seasonal rhinitis, and nonallergic or year-round rhinitis.  Nasal allergies occur certain times of the year.  Allergic rhinitis is caused when allergens in the air trigger the release of histamine in the body.  Histamine causes itching, swelling, and fluid to build up in the fragile linings of the nasal passages, sinuses and eyelids.  An itchy nose and clear discharge are common.  I recommend the following over the counter treatments: You should take a daily dose of antihistamine and Xyzal 5 mg take 1 tablet daily  I also would recommend a nasal spray: Ipratropium Bromide -- A prescription has been sent in. This should help alleviate ear pressure.   HOME CARE:   You can use an over-the-counter saline nasal spray as needed  Avoid areas where there is heavy dust, mites, or molds  Stay indoors on windy days during the pollen season  Keep windows closed in home, at least in bedroom; use air conditioner.  Use high-efficiency house air filter  Keep windows closed in car, turn AC on re-circulate  Avoid playing out with dog during pollen season  GET HELP RIGHT AWAY IF:   If your symptoms do not improve within 10 days  You become short of breath  You develop yellow or green discharge from your nose for over 3 days  You have coughing fits  MAKE SURE YOU:   Understand these instructions  Will watch your condition  Will get help right away if you are not doing  well or get worse  Thank you for choosing an e-visit. Your e-visit answers were reviewed by a board certified advanced clinical practitioner to complete your personal care plan. Depending upon the condition, your plan could have included both over the counter or prescription medications. Please review your pharmacy choice. Be sure that the pharmacy you have chosen is open so that you can pick up your prescription now.  If there is a problem you may message your provider in [place] to have the prescription routed to another pharmacy. Your safety is important to [name]. If you have drug allergies check your prescription carefully.  For the next 24 hours, you can use MyChart to ask questions about today's visit, request a non-urgent call back, or ask for a work or school excuse from your e-visit provider. You will get an email in the next two days asking about your experience. I hope that your e-visit has been valuable and will speed your recovery.

## 2019-02-28 NOTE — Progress Notes (Signed)
I have spent 5 minutes in review of e-visit questionnaire, review and updating patient chart, medical decision making and response to patient.   Dejion Grillo Cody Dawnita Molner, PA-C    

## 2019-03-05 DIAGNOSIS — Z6841 Body Mass Index (BMI) 40.0 and over, adult: Secondary | ICD-10-CM | POA: Diagnosis not present

## 2019-03-05 DIAGNOSIS — J309 Allergic rhinitis, unspecified: Secondary | ICD-10-CM | POA: Diagnosis not present

## 2019-03-05 DIAGNOSIS — G4700 Insomnia, unspecified: Secondary | ICD-10-CM | POA: Diagnosis not present

## 2019-03-05 DIAGNOSIS — Z872 Personal history of diseases of the skin and subcutaneous tissue: Secondary | ICD-10-CM | POA: Diagnosis not present

## 2019-03-07 DIAGNOSIS — J3089 Other allergic rhinitis: Secondary | ICD-10-CM | POA: Diagnosis not present

## 2019-03-07 DIAGNOSIS — J301 Allergic rhinitis due to pollen: Secondary | ICD-10-CM | POA: Diagnosis not present

## 2019-03-22 ENCOUNTER — Telehealth: Payer: 59 | Admitting: Family

## 2019-03-22 DIAGNOSIS — J302 Other seasonal allergic rhinitis: Secondary | ICD-10-CM | POA: Diagnosis not present

## 2019-03-22 NOTE — Progress Notes (Signed)
E visit for Allergic Rhinitis We are sorry that you are not feeling well.  Here is how we plan to help!  Based on what you have shared with me it looks like you have Allergic Rhinitis.  Rhinitis is when a reaction occurs that causes nasal congestion, runny nose, sneezing, and itching.  Most types of rhinitis are caused by an inflammation and are associated with symptoms in the eyes ears or throat. There are several types of rhinitis.  The most common are acute rhinitis, which is usually caused by a viral illness, allergic or seasonal rhinitis, and nonallergic or year-round rhinitis.  Nasal allergies occur certain times of the year.  Allergic rhinitis is caused when allergens in the air trigger the release of histamine in the body.  Histamine causes itching, swelling, and fluid to build up in the fragile linings of the nasal passages, sinuses and eyelids.  An itchy nose and clear discharge are common.  I recommend the following over the counter treatments: Xyzal 5 mg take 1 tablet daily   You may also benefit from eye drops such as: Systane 1-2 driops each eye twice daily as needed  HOME CARE:   You can use an over-the-counter saline nasal spray as needed  Avoid areas where there is heavy dust, mites, or molds  Stay indoors on windy days during the pollen season  Keep windows closed in home, at least in bedroom; use air conditioner.  Use high-efficiency house air filter  Keep windows closed in car, turn AC on re-circulate  Avoid playing out with dog during pollen season  GET HELP RIGHT AWAY IF:   If your symptoms do not improve within 10 days  You become short of breath  You develop yellow or green discharge from your nose for over 3 days  You have coughing fits  MAKE SURE YOU:   Understand these instructions  Will watch your condition  Will get help right away if you are not doing well or get worse  Thank you for choosing an e-visit. Your e-visit answers were  reviewed by a board certified advanced clinical practitioner to complete your personal care plan. Depending upon the condition, your plan could have included both over the counter or prescription medications. Please review your pharmacy choice. Be sure that the pharmacy you have chosen is open so that you can pick up your prescription now.  If there is a problem you may message your provider in MyChart to have the prescription routed to another pharmacy. Your safety is important to us. If you have drug allergies check your prescription carefully.  For the next 24 hours, you can use MyChart to ask questions about today's visit, request a non-urgent call back, or ask for a work or school excuse from your e-visit provider. You will get an email in the next two days asking about your experience. I hope that your e-visit has been valuable and will speed your recovery.      Greater than 5 minutes, yet less than 10 minutes of time have been spent researching, coordinating, and implementing care for this patient today.  Thank you for the details you included in the comment boxes. Those details are very helpful in determining the best course of treatment for you and help us to provide the best care.  

## 2019-03-28 ENCOUNTER — Encounter: Payer: Self-pay | Admitting: Emergency Medicine

## 2019-03-28 ENCOUNTER — Telehealth: Payer: 59 | Admitting: Emergency Medicine

## 2019-03-28 DIAGNOSIS — J019 Acute sinusitis, unspecified: Secondary | ICD-10-CM | POA: Diagnosis not present

## 2019-03-28 DIAGNOSIS — B9689 Other specified bacterial agents as the cause of diseases classified elsewhere: Secondary | ICD-10-CM | POA: Diagnosis not present

## 2019-03-28 MED ORDER — AMOXICILLIN-POT CLAVULANATE 875-125 MG PO TABS: 1.0000 | ORAL_TABLET | Freq: Two times a day (BID) | ORAL | 0 refills | Status: DC

## 2019-03-28 NOTE — Progress Notes (Addendum)
We are sorry that you are not feeling well. Here is how we plan to help! Based on what you have shared with me it looks like you have sinusitis. Sinusitis is inflammation and infection in the sinus cavities of the head. Based on your presentation I believe you most likely have Acute Bacterial Sinusitis. This is an infection caused by bacteria and is treated with antibiotics. I have prescribed Augmentin 875mg /125mg  one tablet twice daily with food for 7 days. You may use an oral decongestant such as Mucinex D or if you have glaucoma or high blood pressure use plain Mucinex. Saline nasal spray help and can safely be used as often as needed for congestion. If you develop worsening sinus pain, fever or notice severe headache and vision changes, or if symptoms are not better after completion of antibiotic, please schedule an appointment with a health care provider.  Sinus infections are not as easily transmitted as other respiratory infection, however we still recommend that you avoid close contact with loved ones, especially the very young and elderly. Remember to wash your hands thoroughly throughout the day as this is the number one way to prevent the spread of infection! Home Care: Only take medications as instructed by your medical team.  Complete the entire course of an antibiotic.  Do not take these medications with alcohol.  A steam or ultrasonic humidifier can help congestion. You can place a towel over your head and breathe in the steam from hot water coming from a faucet.  Avoid close contacts especially the very young and the elderly.  Cover your mouth when you cough or sneeze.  Always remember to wash your hands. Get Help Right Away If: You develop worsening fever or sinus pain.  You develop a severe head ache or visual changes.  Your symptoms persist after you have completed your treatment plan. Make sure you Understand these instructions.  Will watch your condition.  Will get help right  away if you are not doing well or get worse. Your e-visit answers were reviewed by a board certified advanced clinical practitioner to complete your personal care plan. Depending on the condition, your plan could have included both over the counter or prescription medications. If there is a problem please reply once you have received a response from your provider. Your safety is important to [name]. If you have drug allergies check your prescription carefully.  You can use MyChart to ask questions about today's visit, request a non-urgent call back, or ask for a work or school excuse for 24 hours related to this e-Visit. If it has been greater than 24 hours you will need to follow up with your provider, or enter a new e-Visit to address those concerns. You will get an e-mail in the next two days asking about your experience. I hope that your e-visit has been valuable and will speed your recovery. Thank you for using e-visits.  Approximately 5 to 10 minutes was spent reviewing chart, coordinating and implementing care for this patient today.

## 2019-03-29 ENCOUNTER — Other Ambulatory Visit: Payer: Self-pay | Admitting: Physician Assistant

## 2019-03-29 NOTE — Telephone Encounter (Signed)
Patient was seen by you thru E-Visit, please advise if refill can be sent in.   Last refill: 7.23.20 #30 mL Last OV: 7.23.20

## 2019-04-01 MED FILL — MONTELUKAST SOD 10 MG TAB: 10 | 90 days supply | Qty: 90 | Fill #1

## 2019-04-04 DIAGNOSIS — J301 Allergic rhinitis due to pollen: Secondary | ICD-10-CM | POA: Diagnosis not present

## 2019-04-04 DIAGNOSIS — J3089 Other allergic rhinitis: Secondary | ICD-10-CM | POA: Diagnosis not present

## 2019-04-07 ENCOUNTER — Telehealth: Payer: 59 | Admitting: Nurse Practitioner

## 2019-04-07 DIAGNOSIS — R059 Cough, unspecified: Secondary | ICD-10-CM

## 2019-04-07 DIAGNOSIS — R05 Cough: Secondary | ICD-10-CM

## 2019-04-07 DIAGNOSIS — J0111 Acute recurrent frontal sinusitis: Secondary | ICD-10-CM

## 2019-04-07 MED ORDER — FLUTICASONE PROPIONATE 50 MCG/ACT NA SUSP: 2.0000 | Freq: Every day | NASAL | 6 refills | Status: DC

## 2019-04-07 MED ORDER — BENZONATATE 100 MG PO CAPS: 100.0000 mg | ORAL_CAPSULE | Freq: Three times a day (TID) | ORAL | 0 refills | Status: DC | PRN

## 2019-04-07 NOTE — Addendum Note (Signed)
Addended by: Chevis Pretty on: 04/07/2019 01:18 PM   Modules accepted: Orders

## 2019-04-07 NOTE — Progress Notes (Signed)

## 2019-04-09 DIAGNOSIS — J0101 Acute recurrent maxillary sinusitis: Secondary | ICD-10-CM | POA: Diagnosis not present

## 2019-04-09 DIAGNOSIS — Z872 Personal history of diseases of the skin and subcutaneous tissue: Secondary | ICD-10-CM | POA: Diagnosis not present

## 2019-04-09 DIAGNOSIS — J309 Allergic rhinitis, unspecified: Secondary | ICD-10-CM | POA: Diagnosis not present

## 2019-04-09 DIAGNOSIS — G4700 Insomnia, unspecified: Secondary | ICD-10-CM | POA: Diagnosis not present

## 2019-04-17 DIAGNOSIS — Z6841 Body Mass Index (BMI) 40.0 and over, adult: Secondary | ICD-10-CM | POA: Diagnosis not present

## 2019-04-17 DIAGNOSIS — J309 Allergic rhinitis, unspecified: Secondary | ICD-10-CM | POA: Diagnosis not present

## 2019-04-17 DIAGNOSIS — J019 Acute sinusitis, unspecified: Secondary | ICD-10-CM | POA: Diagnosis not present

## 2019-04-17 DIAGNOSIS — G4700 Insomnia, unspecified: Secondary | ICD-10-CM | POA: Diagnosis not present

## 2019-04-17 DIAGNOSIS — E559 Vitamin D deficiency, unspecified: Secondary | ICD-10-CM | POA: Diagnosis not present

## 2019-04-17 DIAGNOSIS — Z872 Personal history of diseases of the skin and subcutaneous tissue: Secondary | ICD-10-CM | POA: Diagnosis not present

## 2019-04-22 NOTE — Progress Notes (Deleted)
Approximately 5 to 10 minutes was spent reviewing chart, coordinating and implementing care for this patient on 03/28/2019.

## 2019-04-25 DIAGNOSIS — J301 Allergic rhinitis due to pollen: Secondary | ICD-10-CM | POA: Diagnosis not present

## 2019-04-25 DIAGNOSIS — J3089 Other allergic rhinitis: Secondary | ICD-10-CM | POA: Diagnosis not present

## 2019-05-09 DIAGNOSIS — J3089 Other allergic rhinitis: Secondary | ICD-10-CM | POA: Diagnosis not present

## 2019-05-09 DIAGNOSIS — J301 Allergic rhinitis due to pollen: Secondary | ICD-10-CM | POA: Diagnosis not present

## 2019-05-14 DIAGNOSIS — J301 Allergic rhinitis due to pollen: Secondary | ICD-10-CM | POA: Diagnosis not present

## 2019-05-14 DIAGNOSIS — J3089 Other allergic rhinitis: Secondary | ICD-10-CM | POA: Diagnosis not present

## 2019-05-16 DIAGNOSIS — F419 Anxiety disorder, unspecified: Secondary | ICD-10-CM | POA: Diagnosis not present

## 2019-05-19 ENCOUNTER — Telehealth: Payer: 59 | Admitting: Family

## 2019-05-19 DIAGNOSIS — H00015 Hordeolum externum left lower eyelid: Secondary | ICD-10-CM

## 2019-05-19 MED ORDER — BACITRACIN-POLYMYXIN B 500-10000 UNIT/GM OP OINT: 1.0000 "application " | TOPICAL_OINTMENT | Freq: Four times a day (QID) | OPHTHALMIC | 0 refills | Status: DC

## 2019-05-19 NOTE — Progress Notes (Signed)
We are sorry that you are not feeling well. Here is how we plan to help!  Based on what you have shared with me it looks like you have a stye.  A stye is an inflammation of the eyelid.  It is often a red, painful lump near the edge of the eyelid that may look like a boil or a pimple.  A stye develops when an infection occurs at the base of an eyelash.   We have made appropriate suggestions for you based upon your presentation: Simple styes can be treated without medical intervention.  Most styes either resolve spontaneously or resolve with simple home treatment by applying warm compresses or heated washcloth to the stye for about 10-15 minutes three to four times a day. This causes the stye to drain and resolve.  I have sent in a prescription of Polysporin to reply to your eye four times a day.  HOME CARE:   Wash your hands often!  Let the stye open on its own. Don't squeeze or open it.  Don't rub your eyes. This can irritate your eyes and let in bacteria.  If you need to touch your eyes, wash your hands first.  Don't wear eye makeup or contact lenses until the area has healed.  GET HELP RIGHT AWAY IF:   Your symptoms do not improve.  You develop blurred or loss of vision.  Your symptoms worsen (increased discharge, pain or redness).  Thank you for choosing an e-visit.  Your e-visit answers were reviewed by a board certified advanced clinical practitioner to complete your personal care plan.  Depending upon the condition, your plan could have included both over the counter or prescription medications.  Please review your pharmacy choice.  Make sure the pharmacy is open so you can pick up prescription now.  If there is a problem, you may contact your provider through [employer] and have the prescription routed to another pharmacy.    Your safety is important to [name].  If you have drug allergies check your prescription carefully.  For the next 24 hours you can use MyChart to ask  questions about today's visit, request a non-urgent call back, or ask for a work or school excuse.  You will get an email in the next two days asking about your experience.  I hope you that your e-visit has been valuable and will speed your recovery.   Approximately 5 minutes was spent documenting and reviewing patient's chart.

## 2019-05-22 ENCOUNTER — Telehealth: Payer: 59 | Admitting: Family

## 2019-05-22 DIAGNOSIS — H04322 Acute dacryocystitis of left lacrimal passage: Secondary | ICD-10-CM | POA: Diagnosis not present

## 2019-05-22 DIAGNOSIS — H00014 Hordeolum externum left upper eyelid: Secondary | ICD-10-CM

## 2019-05-22 NOTE — Progress Notes (Signed)
Based on what you shared with me, I feel your condition warrants further evaluation and I recommend that you be seen for a face to face office visit.  Since your symptoms have not improved with the antibiotic ointment  And warm compression you need to be seen face to face to be evaluated.   NOTE: If you entered your credit card information for this eVisit, you will not be charged. You may see a "hold" on your card for the $35 but that hold will drop off and you will not have a charge processed.  If you are having a true medical emergency please call 911.     For an urgent face to face visit, [place] has four urgent care centers for your convenience:   . [facility]    [phone]                  Get Driving Directions  [address], [place] [address] . 10 am to 8 pm Monday-Friday . 12 pm to 8 pm Saturday-Sunday   . [facility] at [place]                  Get Driving Directions  [address], [place], [place] [address] . 8 am to 8 pm Monday-Friday . 9 am to 6 pm Saturday . 11 am to 6 pm Sunday   . [facility] at [place]                  Get Driving Directions   [address], [place] [address] . 8 am to 8 pm Monday-Friday . 8 am to 4 pm Saturday-Sunday    . [facility] at [place]                    Get Driving Directions  [phone]  [address], [place],  [address]  . Monday-Friday, 12 PM to 6 PM    Your e-visit answers were reviewed by a board certified advanced clinical practitioner to complete your personal care plan.  Thank you for using e-Visits.

## 2019-05-28 DIAGNOSIS — F419 Anxiety disorder, unspecified: Secondary | ICD-10-CM | POA: Diagnosis not present

## 2019-05-30 DIAGNOSIS — J301 Allergic rhinitis due to pollen: Secondary | ICD-10-CM | POA: Diagnosis not present

## 2019-05-30 DIAGNOSIS — J3089 Other allergic rhinitis: Secondary | ICD-10-CM | POA: Diagnosis not present

## 2019-05-31 ENCOUNTER — Telehealth: Payer: 59 | Admitting: Nurse Practitioner

## 2019-05-31 DIAGNOSIS — B9789 Other viral agents as the cause of diseases classified elsewhere: Secondary | ICD-10-CM | POA: Diagnosis not present

## 2019-05-31 DIAGNOSIS — J019 Acute sinusitis, unspecified: Secondary | ICD-10-CM | POA: Diagnosis not present

## 2019-05-31 MED ORDER — FLUTICASONE PROPIONATE 50 MCG/ACT NA SUSP: 2.0000 | Freq: Every day | NASAL | 0 refills | Status: DC

## 2019-05-31 MED ORDER — PSEUDOEPH-BROMPHEN-DM 30-2-10 MG/5ML PO SYRP: 5.0000 mL | ORAL_SOLUTION | Freq: Four times a day (QID) | ORAL | 0 refills | Status: AC | PRN

## 2019-05-31 NOTE — Progress Notes (Signed)
We are sorry that you are not feeling well.  Here is how we plan to help!  Based on what you have shared with me it looks like you have sinusitis.  Sinusitis is inflammation and infection in the sinus cavities of the head.  Based on your presentation I believe you most likely have Acute Viral Sinusitis.This is an infection most likely caused by a virus. There is not specific treatment for viral sinusitis other than to help you with the symptoms until the infection runs its course.  You may use an oral decongestant such as Mucinex D or if you have glaucoma or high blood pressure use plain Mucinex. Saline nasal spray help and can safely be used as often as needed for congestion, I have prescribed: Fluticasone nasal spray two sprays in each nostril once a day and Bromfed cough syrup, 11mL up to four times daily as needed for cough and upper respiratory symptoms. You also need to take or continue taking Zyrtec mg daily while symptoms persist. Normal saline nasal spray is also helpful to use throughout the day to help "rinse" the sinuses and to prevent a bacterial infection.  If your symptoms do not improve over the next 5-7 days or rapidly worsen, an antibiotic may be needed at that time.  Some authorities believe that zinc sprays or the use of Echinacea may shorten the course of your symptoms.  Sinus infections are not as easily transmitted as other respiratory infection, however we still recommend that you avoid close contact with loved ones, especially the very young and elderly.  Remember to wash your hands thoroughly throughout the day as this is the number one way to prevent the spread of infection!  Home Care:  Only take medications as instructed by your medical team.  Do not take these medications with alcohol.  A steam or ultrasonic humidifier can help congestion.  You can place a towel over your head and breathe in the steam from hot water coming from a faucet.  Avoid close contacts especially  the very young and the elderly.  Cover your mouth when you cough or sneeze.  Always remember to wash your hands.  Get Help Right Away If:  You develop worsening fever or sinus pain.  You develop a severe head ache or visual changes.  Your symptoms persist after you have completed your treatment plan.  Make sure you  Understand these instructions.  Will watch your condition.  Will get help right away if you are not doing well or get worse.  Your e-visit answers were reviewed by a board certified advanced clinical practitioner to complete your personal care plan.  Depending on the condition, your plan could have included both over the counter or prescription medications.  If there is a problem please reply  once you have received a response from your provider.  Your safety is important to [name].  If you have drug allergies check your prescription carefully.    You can use MyChart to ask questions about today's visit, request a non-urgent call back, or ask for a work or school excuse for 24 hours related to this e-Visit. If it has been greater than 24 hours you will need to follow up with your provider, or enter a new e-Visit to address those concerns.  You will get an e-mail in the next two days asking about your experience.  I hope that your e-visit has been valuable and will speed your recovery. Thank you for using e-visits.  I  have spent 5-10 minutes reviewing and documenting in the patient's chart.

## 2019-06-05 DIAGNOSIS — F419 Anxiety disorder, unspecified: Secondary | ICD-10-CM | POA: Diagnosis not present

## 2019-06-13 ENCOUNTER — Telehealth: Payer: 59 | Admitting: Physician Assistant

## 2019-06-13 DIAGNOSIS — J018 Other acute sinusitis: Secondary | ICD-10-CM | POA: Diagnosis not present

## 2019-06-13 MED ORDER — PROMETHAZINE-DM 6.25-15 MG/5ML PO SYRP: 5.0000 mL | ORAL_SOLUTION | Freq: Four times a day (QID) | ORAL | 0 refills | Status: DC | PRN

## 2019-06-13 MED ORDER — DOXYCYCLINE HYCLATE 100 MG PO TABS: 100.0000 mg | ORAL_TABLET | Freq: Two times a day (BID) | ORAL | 0 refills | Status: DC

## 2019-06-13 MED ORDER — FLUTICASONE PROPIONATE 50 MCG/ACT NA SUSP: 2.0000 | Freq: Every day | NASAL | 6 refills | Status: DC

## 2019-06-13 NOTE — Progress Notes (Signed)
I spent 10 min on this E-Visit  We are sorry that you are not feeling well.  Here is how we plan to help!  Based on what you have shared with me it looks like you have sinusitis.  Sinusitis is inflammation and infection in the sinus cavities of the head.  Based on your presentation I believe you most likely have Acute Bacterial Sinusitis.  This is an infection caused by bacteria and is treated with antibiotics. I have prescribed Doxycycline 100mg  by mouth twice a day for 10 days. You may use an oral decongestant such as Mucinex D or if you have glaucoma or high blood pressure use plain Mucinex. Saline nasal spray help and can safely be used as often as needed for congestion. I am ordering flonase to assist with your symptoms. If you develop worsening sinus pain, fever or notice severe headache and vision changes, or if symptoms are not better after completion of antibiotic, please schedule an appointment with a health care provider.    Sinus infections are not as easily transmitted as other respiratory infection, however we still recommend that you avoid close contact with loved ones, especially the very young and elderly.  Remember to wash your hands thoroughly throughout the day as this is the number one way to prevent the spread of infection!  Home Care:  Only take medications as instructed by your medical team.  Complete the entire course of an antibiotic.  Do not take these medications with alcohol.  A steam or ultrasonic humidifier can help congestion.  You can place a towel over your head and breathe in the steam from hot water coming from a faucet.  Avoid close contacts especially the very young and the elderly.  Cover your mouth when you cough or sneeze.  Always remember to wash your hands.  Get Help Right Away If:  You develop worsening fever or sinus pain.  You develop a severe head ache or visual changes.  Your symptoms persist after you have completed your treatment  plan.  Make sure you  Understand these instructions.  Will watch your condition.  Will get help right away if you are not doing well or get worse.  Your e-visit answers were reviewed by a board certified advanced clinical practitioner to complete your personal care plan.  Depending on the condition, your plan could have included both over the counter or prescription medications.  If there is a problem please reply  once you have received a response from your provider.  Your safety is important to [name].  If you have drug allergies check your prescription carefully.    You can use MyChart to ask questions about today's visit, request a non-urgent call back, or ask for a work or school excuse for 24 hours related to this e-Visit. If it has been greater than 24 hours you will need to follow up with your provider, or enter a new e-Visit to address those concerns.  You will get an e-mail in the next two days asking about your experience.  I hope that your e-visit has been valuable and will speed your recovery. Thank you for using e-visits.

## 2019-06-13 NOTE — Addendum Note (Signed)
Addended by: Lily Kocher on: 06/13/2019 10:42 AM   Modules accepted: Orders

## 2019-06-13 NOTE — Addendum Note (Signed)
Addended by: Margarita Mail on: 06/13/2019 01:25 PM   Modules accepted: Orders

## 2019-06-27 DIAGNOSIS — J301 Allergic rhinitis due to pollen: Secondary | ICD-10-CM | POA: Diagnosis not present

## 2019-06-27 DIAGNOSIS — J3089 Other allergic rhinitis: Secondary | ICD-10-CM | POA: Diagnosis not present

## 2019-07-02 DIAGNOSIS — J3089 Other allergic rhinitis: Secondary | ICD-10-CM | POA: Diagnosis not present

## 2019-07-02 DIAGNOSIS — J301 Allergic rhinitis due to pollen: Secondary | ICD-10-CM | POA: Diagnosis not present

## 2019-07-08 MED FILL — MONTELUKAST SOD 10 MG TAB: 10 | 90 days supply | Qty: 90 | Fill #2

## 2019-07-09 DIAGNOSIS — J301 Allergic rhinitis due to pollen: Secondary | ICD-10-CM | POA: Diagnosis not present

## 2019-07-09 DIAGNOSIS — J3089 Other allergic rhinitis: Secondary | ICD-10-CM | POA: Diagnosis not present

## 2019-07-15 ENCOUNTER — Telehealth: Payer: 59 | Admitting: Family

## 2019-07-15 DIAGNOSIS — J069 Acute upper respiratory infection, unspecified: Secondary | ICD-10-CM | POA: Diagnosis not present

## 2019-07-15 MED ORDER — BENZONATATE 100 MG PO CAPS: 100.0000 mg | ORAL_CAPSULE | Freq: Three times a day (TID) | ORAL | 0 refills | Status: DC | PRN

## 2019-07-15 MED ORDER — FLUTICASONE PROPIONATE 50 MCG/ACT NA SUSP: 2.0000 | Freq: Every day | NASAL | 6 refills | Status: DC

## 2019-07-15 NOTE — Progress Notes (Signed)
We are sorry you are not feeling well.  Here is how we plan to help!  Based on what you have shared with me, it looks like you may have a viral upper respiratory infection.  Upper respiratory infections are caused by a large number of viruses; however, rhinovirus is the most common cause.   Symptoms vary from person to person, with common symptoms including sore throat, cough, fatigue or lack of energy and feeling of general discomfort.  A low-grade fever of up to 100.4 may present, but is often uncommon.  Symptoms vary however, and are closely related to a person's age or underlying illnesses.  The most common symptoms associated with an upper respiratory infection are nasal discharge or congestion, cough, sneezing, headache and pressure in the ears and face.  These symptoms usually persist for about 3 to 10 days, but can last up to 2 weeks.  It is important to know that upper respiratory infections do not cause serious illness or complications in most cases.    Upper respiratory infections can be transmitted from person to person, with the most common method of transmission being a person's hands.  The virus is able to live on the skin and can infect other persons for up to 2 hours after direct contact.  Also, these can be transmitted when someone coughs or sneezes; thus, it is important to cover the mouth to reduce this risk.  To keep the spread of the illness at bay, good hand hygiene is very important.  This is an infection that is most likely caused by a virus. There are no specific treatments other than to help you with the symptoms until the infection runs its course.  We are sorry you are not feeling well.  Here is how we plan to help!  Given your current symptoms, you need to be COVID tested. You can go to one of the testing sites listed below, while they are opened (see hours). You do not need an order and will stay in your car during the test. You do need to self isolate until your results  return and if positive 14 days from when your symptoms started and until you are 3 days fever free.   Testing Locations (Monday - Friday, 10 a.m. - 3 p.m.) . [place]: [facility], [address] [place], [place]  . [place]: [address], [address] [place], [place] (entrance off [employer])  . [place] [facility]: (Closed each Monday): Testing site relocated to the short stay covered drive at [facility]. (Use the [employer] entrance to [facility] next to [facility].)    For nasal congestion, you may use an oral decongestants such as Mucinex D or if you have glaucoma or high blood pressure use plain Mucinex.  Saline nasal spray or nasal drops can help and can safely be used as often as needed for congestion.  For your congestion, I have prescribed Fluticasone nasal spray one spray in each nostril twice a day  If you do not have a history of heart disease, hypertension, diabetes or thyroid disease, prostate/bladder issues or glaucoma, you may also use Sudafed to treat nasal congestion.  It is highly recommended that you consult with a pharmacist or your primary care physician to ensure this medication is safe for you to take.     If you have a cough, you may use cough suppressants such as Delsym and Robitussin.  If  you have glaucoma or high blood pressure, you can also use Coricidin HBP.   For cough I have prescribed for you A prescription cough medication called Tessalon Perles 100 mg. You may take 1-2 capsules every 8 hours as needed for cough.  If you have a sore or scratchy throat, use a saltwater gargle-  to  teaspoon of salt dissolved in a 4-ounce to 8-ounce glass of warm water.  Gargle the solution for approximately 15-30 seconds and then spit.  It is important not to swallow the solution.  You can also use throat lozenges/cough drops and Chloraseptic spray to help with throat pain or discomfort.   Warm or cold liquids can also be helpful in relieving throat pain.  For headache, pain or general discomfort, you can use Ibuprofen or Tylenol as directed.   Some authorities believe that zinc sprays or the use of Echinacea may shorten the course of your symptoms.   HOME CARE . Only take medications as instructed by your medical team. . Be sure to drink plenty of fluids. Water is fine as well as fruit juices, sodas and electrolyte beverages. You may want to stay away from caffeine or alcohol. If you are nauseated, try taking small sips of liquids. How do you know if you are getting enough fluid? Your urine should be a pale yellow or almost colorless. . Get rest. . Taking a steamy shower or using a humidifier may help nasal congestion and ease sore throat pain. You can place a towel over your head and breathe in the steam from hot water coming from a faucet. . Using a saline nasal spray works much the same way. . Cough drops, hard candies and sore throat lozenges may ease your cough. . Avoid close contacts especially the very young and the elderly . Cover your mouth if you cough or sneeze . Always remember to wash your hands.   GET HELP RIGHT AWAY IF: . You develop worsening fever. . If your symptoms do not improve within 10 days . You develop yellow or green discharge from your nose over 3 days. . You have coughing fits . You develop a severe head ache or visual changes. . You develop shortness of breath, difficulty breathing or start having chest pain . Your symptoms persist after you have completed your treatment plan  MAKE SURE YOU   Understand these instructions.  Will watch your condition.  Will get help right away if you are not doing well or get worse.  Your e-visit answers were reviewed by a board certified advanced clinical practitioner to complete your personal care plan. Depending upon the condition, your plan could have included both over the counter or prescription  medications. Please review your pharmacy choice. If there is a problem, you may call our nursing hot line at and have the prescription routed to another pharmacy. Your safety is important to [name]. If you have drug allergies check your prescription carefully.   You can use MyChart to ask questions about today's visit, request a non-urgent call back, or ask for a work or school excuse for 24 hours related to this e-Visit. If it has been greater than 24 hours you will need to follow up with your provider, or enter a new e-Visit to address those concerns. You will get an e-mail in the next two days asking about your experience.  I hope that your e-visit has been valuable and will speed your recovery. Thank you for using e-visits.    Approximately  5 minutes was spent documenting and reviewing patient's chart.    

## 2019-07-16 DIAGNOSIS — J3089 Other allergic rhinitis: Secondary | ICD-10-CM | POA: Diagnosis not present

## 2019-07-16 DIAGNOSIS — J301 Allergic rhinitis due to pollen: Secondary | ICD-10-CM | POA: Diagnosis not present

## 2019-07-23 DIAGNOSIS — F419 Anxiety disorder, unspecified: Secondary | ICD-10-CM | POA: Diagnosis not present

## 2019-07-23 DIAGNOSIS — J3089 Other allergic rhinitis: Secondary | ICD-10-CM | POA: Diagnosis not present

## 2019-07-23 DIAGNOSIS — J301 Allergic rhinitis due to pollen: Secondary | ICD-10-CM | POA: Diagnosis not present

## 2019-07-24 DIAGNOSIS — J301 Allergic rhinitis due to pollen: Secondary | ICD-10-CM | POA: Diagnosis not present

## 2019-07-24 DIAGNOSIS — J329 Chronic sinusitis, unspecified: Secondary | ICD-10-CM | POA: Diagnosis not present

## 2019-07-24 DIAGNOSIS — R05 Cough: Secondary | ICD-10-CM | POA: Diagnosis not present

## 2019-07-24 DIAGNOSIS — J3089 Other allergic rhinitis: Secondary | ICD-10-CM | POA: Diagnosis not present

## 2019-07-30 DIAGNOSIS — J3089 Other allergic rhinitis: Secondary | ICD-10-CM | POA: Diagnosis not present

## 2019-07-30 DIAGNOSIS — J301 Allergic rhinitis due to pollen: Secondary | ICD-10-CM | POA: Diagnosis not present

## 2019-08-06 DIAGNOSIS — J3089 Other allergic rhinitis: Secondary | ICD-10-CM | POA: Diagnosis not present

## 2019-08-06 DIAGNOSIS — J301 Allergic rhinitis due to pollen: Secondary | ICD-10-CM | POA: Diagnosis not present

## 2019-08-07 ENCOUNTER — Telehealth: Payer: 59 | Admitting: Family

## 2019-08-07 DIAGNOSIS — J069 Acute upper respiratory infection, unspecified: Secondary | ICD-10-CM | POA: Diagnosis not present

## 2019-08-07 MED ORDER — FLUTICASONE PROPIONATE 50 MCG/ACT NA SUSP: 2.0000 | Freq: Every day | NASAL | 6 refills | Status: DC

## 2019-08-07 MED ORDER — BENZONATATE 100 MG PO CAPS: 100.0000 mg | ORAL_CAPSULE | Freq: Three times a day (TID) | ORAL | 0 refills | Status: DC | PRN

## 2019-08-07 NOTE — Progress Notes (Signed)

## 2019-08-10 ENCOUNTER — Telehealth: Payer: 59 | Admitting: Family

## 2019-08-10 DIAGNOSIS — J069 Acute upper respiratory infection, unspecified: Secondary | ICD-10-CM

## 2019-08-10 NOTE — Progress Notes (Signed)
Based on what you shared with me, I feel your condition warrants further evaluation and I recommend that you be seen for a face to face office visit. I am concerned that you are not getting relief an have had a few evisits.   NOTE: If you entered your credit card information for this eVisit, you will not be charged. You may see a "hold" on your card for the $35 but that hold will drop off and you will not have a charge processed.   If you are having a true medical emergency please call 911.      For an urgent face to face visit, [place] has five urgent care centers for your convenience:      NEW:  [facility] at [facility] Directions [phone] [address] [place], [place] [address] . 10 am - 6pm Monday - Friday    [facility]) Get Driving Directions [phone] [address] [place], [place] [address] . 10 am to 8 pm Monday-Friday . 12 pm to 8 pm [facility] Urgent Care at [facility] Get Driving Directions [phone] [address] [place], [place] [address] . 8 am to 8 pm Monday-Friday . 9 am to 6 pm Saturday . 11 am to 6 pm Sunday     [facility] at [facility] Get Driving Directions  [phone] [address] [place], [place] [address] . 8 am to 8 pm Monday-Friday . 8 am to 4 pm [facility] Urgent Care at [facility] Directions [phone] [address] [place], [place] [address] . 12 pm to 6 pm Monday-Friday      Your e-visit answers were reviewed by a board certified advanced clinical practitioner to complete your personal care plan.  Thank you for using e-Visits.

## 2019-08-15 DIAGNOSIS — M9902 Segmental and somatic dysfunction of thoracic region: Secondary | ICD-10-CM | POA: Diagnosis not present

## 2019-08-15 DIAGNOSIS — M546 Pain in thoracic spine: Secondary | ICD-10-CM | POA: Diagnosis not present

## 2019-08-15 DIAGNOSIS — M542 Cervicalgia: Secondary | ICD-10-CM | POA: Diagnosis not present

## 2019-08-15 DIAGNOSIS — M9901 Segmental and somatic dysfunction of cervical region: Secondary | ICD-10-CM | POA: Diagnosis not present

## 2019-08-19 DIAGNOSIS — M9901 Segmental and somatic dysfunction of cervical region: Secondary | ICD-10-CM | POA: Diagnosis not present

## 2019-08-19 DIAGNOSIS — M546 Pain in thoracic spine: Secondary | ICD-10-CM | POA: Diagnosis not present

## 2019-08-19 DIAGNOSIS — M542 Cervicalgia: Secondary | ICD-10-CM | POA: Diagnosis not present

## 2019-08-19 DIAGNOSIS — M9902 Segmental and somatic dysfunction of thoracic region: Secondary | ICD-10-CM | POA: Diagnosis not present

## 2019-08-21 DIAGNOSIS — M9902 Segmental and somatic dysfunction of thoracic region: Secondary | ICD-10-CM | POA: Diagnosis not present

## 2019-08-21 DIAGNOSIS — M542 Cervicalgia: Secondary | ICD-10-CM | POA: Diagnosis not present

## 2019-08-21 DIAGNOSIS — M9901 Segmental and somatic dysfunction of cervical region: Secondary | ICD-10-CM | POA: Diagnosis not present

## 2019-08-21 DIAGNOSIS — M546 Pain in thoracic spine: Secondary | ICD-10-CM | POA: Diagnosis not present

## 2019-08-22 DIAGNOSIS — J301 Allergic rhinitis due to pollen: Secondary | ICD-10-CM | POA: Diagnosis not present

## 2019-08-22 DIAGNOSIS — J3089 Other allergic rhinitis: Secondary | ICD-10-CM | POA: Diagnosis not present

## 2019-08-26 DIAGNOSIS — M546 Pain in thoracic spine: Secondary | ICD-10-CM | POA: Diagnosis not present

## 2019-08-26 DIAGNOSIS — M9901 Segmental and somatic dysfunction of cervical region: Secondary | ICD-10-CM | POA: Diagnosis not present

## 2019-08-26 DIAGNOSIS — M542 Cervicalgia: Secondary | ICD-10-CM | POA: Diagnosis not present

## 2019-08-26 DIAGNOSIS — M9902 Segmental and somatic dysfunction of thoracic region: Secondary | ICD-10-CM | POA: Diagnosis not present

## 2019-08-29 DIAGNOSIS — J301 Allergic rhinitis due to pollen: Secondary | ICD-10-CM | POA: Diagnosis not present

## 2019-08-29 DIAGNOSIS — M9902 Segmental and somatic dysfunction of thoracic region: Secondary | ICD-10-CM | POA: Diagnosis not present

## 2019-08-29 DIAGNOSIS — M546 Pain in thoracic spine: Secondary | ICD-10-CM | POA: Diagnosis not present

## 2019-08-29 DIAGNOSIS — J3089 Other allergic rhinitis: Secondary | ICD-10-CM | POA: Diagnosis not present

## 2019-08-29 DIAGNOSIS — M9901 Segmental and somatic dysfunction of cervical region: Secondary | ICD-10-CM | POA: Diagnosis not present

## 2019-08-29 DIAGNOSIS — M542 Cervicalgia: Secondary | ICD-10-CM | POA: Diagnosis not present

## 2019-08-30 DIAGNOSIS — M9902 Segmental and somatic dysfunction of thoracic region: Secondary | ICD-10-CM | POA: Diagnosis not present

## 2019-08-30 DIAGNOSIS — M546 Pain in thoracic spine: Secondary | ICD-10-CM | POA: Diagnosis not present

## 2019-08-30 DIAGNOSIS — M9901 Segmental and somatic dysfunction of cervical region: Secondary | ICD-10-CM | POA: Diagnosis not present

## 2019-08-30 DIAGNOSIS — M542 Cervicalgia: Secondary | ICD-10-CM | POA: Diagnosis not present

## 2019-09-03 ENCOUNTER — Telehealth: Payer: 59 | Admitting: Nurse Practitioner

## 2019-09-03 DIAGNOSIS — J3089 Other allergic rhinitis: Secondary | ICD-10-CM

## 2019-09-03 DIAGNOSIS — J301 Allergic rhinitis due to pollen: Secondary | ICD-10-CM | POA: Diagnosis not present

## 2019-09-03 MED ORDER — PREDNISONE 10 MG (21) PO TBPK: ORAL_TABLET | ORAL | 0 refills | Status: DC

## 2019-09-03 NOTE — Progress Notes (Signed)
am coming in in the middle of this evisit because the previous provider has gone off call. Reviewing your questionaire and what has transpired I am going to send you in a steroid dose pack to clear up your allergies. I would continue zyrtec and Claritin.  Orders Placed This Encounter     predniSONE (STERAPRED UNI-PAK 21 TAB) 10 MG (21) TBPK tablet         Sig: As directed x 6 days         Dispense:  21 tablet         Refill:  0         Order Specific Question: Supervising Provider         Answer: Eber Hong [[id number]]  Directions for 6 day taper: Day 1: 2 tablets before breakfast, 1 after both lunch & dinner and 2 at bedtime Day 2: 1 tab before breakfast, 1 after both lunch & dinner and 2 at bedtime Day 3: 1 tab at each meal & 1 at bedtime Day 4: 1 tab at breakfast, 1 at lunch, 1 at bedtime Day 5: 1 tab at breakfast & 1 tab at bedtime Day 6: 1 tab at breakfast  I hope this will help!

## 2019-09-10 ENCOUNTER — Other Ambulatory Visit: Payer: Self-pay

## 2019-09-10 DIAGNOSIS — M546 Pain in thoracic spine: Secondary | ICD-10-CM | POA: Diagnosis not present

## 2019-09-10 DIAGNOSIS — M9901 Segmental and somatic dysfunction of cervical region: Secondary | ICD-10-CM | POA: Diagnosis not present

## 2019-09-10 DIAGNOSIS — M542 Cervicalgia: Secondary | ICD-10-CM | POA: Diagnosis not present

## 2019-09-10 DIAGNOSIS — M9902 Segmental and somatic dysfunction of thoracic region: Secondary | ICD-10-CM | POA: Diagnosis not present

## 2019-09-11 ENCOUNTER — Encounter: Payer: Self-pay | Admitting: Family Medicine

## 2019-09-11 ENCOUNTER — Ambulatory Visit (INDEPENDENT_AMBULATORY_CARE_PROVIDER_SITE_OTHER): Payer: 59 | Admitting: Family Medicine

## 2019-09-11 VITALS — BP 120/82 | HR 88 | Temp 97.3°F | Ht 67.0 in | Wt 307.8 lb

## 2019-09-11 DIAGNOSIS — Z7689 Persons encountering health services in other specified circumstances: Secondary | ICD-10-CM

## 2019-09-11 DIAGNOSIS — N926 Irregular menstruation, unspecified: Secondary | ICD-10-CM | POA: Diagnosis not present

## 2019-09-11 DIAGNOSIS — M542 Cervicalgia: Secondary | ICD-10-CM | POA: Diagnosis not present

## 2019-09-11 DIAGNOSIS — E559 Vitamin D deficiency, unspecified: Secondary | ICD-10-CM | POA: Diagnosis not present

## 2019-09-11 DIAGNOSIS — M9901 Segmental and somatic dysfunction of cervical region: Secondary | ICD-10-CM | POA: Diagnosis not present

## 2019-09-11 DIAGNOSIS — L680 Hirsutism: Secondary | ICD-10-CM | POA: Diagnosis not present

## 2019-09-11 DIAGNOSIS — M9902 Segmental and somatic dysfunction of thoracic region: Secondary | ICD-10-CM | POA: Diagnosis not present

## 2019-09-11 DIAGNOSIS — R7303 Prediabetes: Secondary | ICD-10-CM

## 2019-09-11 DIAGNOSIS — M546 Pain in thoracic spine: Secondary | ICD-10-CM | POA: Diagnosis not present

## 2019-09-11 LAB — LIPID PANEL
Cholesterol: 221 mg/dL — ABNORMAL HIGH (ref 0–200)
HDL: 32.6 mg/dL — ABNORMAL LOW (ref >39.00)
LDL Cholesterol: 158 mg/dL — ABNORMAL HIGH (ref 0–99)
NonHDL: 187.97
Total CHOL/HDL Ratio: 7
Triglycerides: 151 mg/dL — ABNORMAL HIGH (ref 0.0–149.0)
VLDL: 30.2 mg/dL (ref 0.0–40.0)

## 2019-09-11 LAB — CBC
HCT: 42 % (ref 36.0–46.0)
Hemoglobin: 14.4 g/dL (ref 12.0–15.0)
MCHC: 34.3 g/dL (ref 30.0–36.0)
MCV: 85.5 fl (ref 78.0–100.0)
Platelets: 315 10*3/uL (ref 150.0–400.0)
RBC: 4.91 Mil/uL (ref 3.87–5.11)
RDW: 13.8 % (ref 11.5–15.5)
WBC: 7.4 10*3/uL (ref 4.0–10.5)

## 2019-09-11 LAB — BASIC METABOLIC PANEL
BUN: 9 mg/dL (ref 6–23)
CO2: 28 mEq/L (ref 19–32)
Calcium: 9.3 mg/dL (ref 8.4–10.5)
Chloride: 105 mEq/L (ref 96–112)
Creatinine, Ser: 0.67 mg/dL (ref 0.40–1.20)
GFR: 105.7 mL/min (ref >60.00)
Glucose, Bld: 109 mg/dL — ABNORMAL HIGH (ref 70–99)
Potassium: 4.3 mEq/L (ref 3.5–5.1)
Sodium: 142 mEq/L (ref 135–145)

## 2019-09-11 LAB — ALT: ALT: 46 U/L — ABNORMAL HIGH (ref 0–35)

## 2019-09-11 LAB — AST: AST: 58 U/L — ABNORMAL HIGH (ref 0–37)

## 2019-09-11 LAB — HEMOGLOBIN A1C: Hgb A1c MFr Bld: 5.6 % (ref 4.6–6.5)

## 2019-09-11 LAB — VITAMIN D 25 HYDROXY (VIT D DEFICIENCY, FRACTURES): VITD: 10.31 ng/mL — ABNORMAL LOW (ref 30.00–100.00)

## 2019-09-11 LAB — TESTOSTERONE: Testosterone: 44.75 ng/dL — ABNORMAL HIGH (ref 15.00–40.00)

## 2019-09-11 LAB — TSH: TSH: 1.57 u[IU]/mL (ref 0.35–4.50)

## 2019-09-11 NOTE — Progress Notes (Signed)
Tamara Diaz is a 29 y.o. female  Chief Complaint  Patient presents with  . Establish Care    Pt would like to discuss being tested for PCOS/weight loss    HPI: Tamara Diaz is a 29 y.o. female here as a new patient to establish care and discuss concerns about weight loss and possible PCOS. 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 She was previously following with [facility].   Pt got her period at 29yo, periods were irregular (3-4 periods per year) until starting on OCPs at 29yo. Pt weighted 175-180lbs at 29yo. She notes hair growth on her chin.   Last PAP: 11/2018  Past Medical History:  Diagnosis Date  . Anxiety   . Back pain   . Depression   . Environmental allergies   . Fatigue   . Lactose intolerance   . Migraines   . Prediabetes   . Vitamin D deficiency     Past Surgical History:  Procedure Laterality Date  . APPENDECTOMY    . CYSTECTOMY    . TONSILLECTOMY AND ADENOIDECTOMY      Social History   Socioeconomic History  . Marital status: Single    Spouse name: Not on file  . Number of children: 0  . Years of education: Not on file  . Highest education level: Not on file  Occupational History  . Occupation: Engineer, production: [employer]  Tobacco Use  . Smoking status: Never Smoker  . Smokeless tobacco: Never Used  Substance and Sexual Activity  . Alcohol use: Yes    Comment: socially  . Drug use: Never  . Sexual activity: Not Currently    Comment: 1st intercourse 29 yo-More than 5 partners  Other Topics Concern  . Not on file  Social History Narrative  . Not on file   Social Determinants of Health   Financial Resource Strain:   . Difficulty of Paying Living Expenses: Not on file  Food Insecurity:   . Worried About Programme researcher, broadcasting/film/video in the Last Year: Not on file  . Ran Out of Food in the Last Year: Not on file  Transportation Needs:   . Lack of Transportation (Medical): Not on file  . Lack of  Transportation (Non-Medical): Not on file  Physical Activity:   . Days of Exercise per Week: Not on file  . Minutes of Exercise per Session: Not on file  Stress:   . Feeling of Stress : Not on file  Social Connections:   . Frequency of Communication with Friends and Family: Not on file  . Frequency of Social Gatherings with Friends and Family: Not on file  . Attends Religious Services: Not on file  . Active Member of Clubs or Organizations: Not on file  . Attends Banker Meetings: Not on file  . Marital Status: Not on file  Intimate Partner Violence:   . Fear of Current or Ex-Partner: Not on file  . Emotionally Abused: Not on file  . Physically Abused: Not on file  . Sexually Abused: Not on file    Family History  Problem Relation Age of Onset  . Diabetes Father   . Hypertension Father   . Heart disease Father   . Depression Father   . Anxiety disorder Father   . Bipolar disorder Father   . Obesity Father       There is no immunization history on file for this patient.  Outpatient  Encounter Medications as of 09/11/2019  Medication Sig  . albuterol (VENTOLIN HFA) 108 (90 Base) MCG/ACT inhaler Inhale 2 puffs into the lungs every 4 (four) hours as needed for shortness of breath.  . cetirizine (ZYRTEC) 10 MG tablet Take 10 mg by mouth daily.  . cholecalciferol (VITAMIN D3) 25 MCG (1000 UNIT) tablet Take 1,000 Units by mouth daily.  . fluticasone (FLONASE) 50 MCG/ACT nasal spray Place 2 sprays into both nostrils daily.  Marland Kitchen ipratropium (ATROVENT) 0.03 % nasal spray Place 2 sprays into both nostrils every 12 (twelve) hours.  Marland Kitchen KAITLIB FE 0.8-25 MG-MCG tablet   . Melatonin 5 MG TABS Take 1 tablet by mouth at bedtime.  . montelukast (SINGULAIR) 10 MG tablet Take 10 mg by mouth at bedtime.  . traZODone (DESYREL) 50 MG tablet Take 50 mg by mouth at bedtime.  Marland Kitchen acetaminophen (TYLENOL) 325 MG tablet Take 650 mg by mouth every 6 (six) hours as needed.  . predniSONE  (STERAPRED UNI-PAK 21 TAB) 10 MG (21) TBPK tablet As directed x 6 days  . promethazine-dextromethorphan (PROMETHAZINE-DM) 6.25-15 MG/5ML syrup Take 5 mLs by mouth 4 (four) times daily as needed for cough.  . Vitamin D, Ergocalciferol, (DRISDOL) 1.25 MG (50000 UT) CAPS capsule Take 1 capsule (50,000 Units total) by mouth every 7 (seven) days.  . [DISCONTINUED] amoxicillin-clavulanate (AUGMENTIN) 875-125 MG tablet Take 1 tablet by mouth 2 (two) times daily. One po bid x 7 days  . [DISCONTINUED] bacitracin-polymyxin b (POLYSPORIN) ophthalmic ointment Place 1 application into the left eye 4 (four) times daily. apply to eye every 12 hours while awake  . [DISCONTINUED] benzonatate (TESSALON PERLES) 100 MG capsule Take 1 capsule (100 mg total) by mouth 3 (three) times daily as needed.  . [DISCONTINUED] doxycycline (VIBRA-TABS) 100 MG tablet Take 1 tablet (100 mg total) by mouth 2 (two) times daily.   No facility-administered encounter medications on file as of 09/11/2019.     ROS: Pertinent positives and negatives noted in HPI. Remainder of ROS non-contributory  Allergies  Allergen Reactions  . Codeine     insomnia   . Lactose Intolerance (Gi) Diarrhea  . Zinc Oxide Rash    BP 120/82 (BP Location: Left Arm, Patient Position: Sitting, Cuff Size: Normal)   Pulse 88   Temp (!) 97.3 F (36.3 C) (Temporal)   Ht 5\' 7"  (1.702 m)   Wt (!) 307 lb 12.8 oz (139.6 kg)   LMP 08/10/2019   SpO2 97%   BMI 48.21 kg/m   Physical Exam  Constitutional: She is oriented to person, place, and time. She appears well-developed and well-nourished. No distress.  Cardiovascular: Normal rate, regular rhythm and normal heart sounds.  Pulmonary/Chest: Effort normal and breath sounds normal. No respiratory distress.  Neurological: She is alert and oriented to person, place, and time. Coordination normal.  Skin: Skin is warm and dry.  Psychiatric: She has a normal mood and affect. Her behavior is normal.     A/P:   1. Encounter to establish care with new doctor  2. Prediabetes - Hemoglobin A1c - Lipid panel - Basic metabolic panel  3. Vitamin D deficiency - taking daily Vit D supplement - VITAMIN D 25 Hydroxy (Vit-D Deficiency, Fractures)  4. Morbid obesity (HCC) 5. Irregular menses 6. Hirsutism - Lipid panel - AST - ALT - Hemoglobin A1c - TSH - CBC - Testosterone - likely PCOS and will discuss metformin/spironolactone once results available  7. Obesity, morbid, BMI 40.0-49.9 (HCC) - Amb Ref to Medical Weight  Management   This visit occurred during the SARS-CoV-2 public health emergency.  Safety protocols were in place, including screening questions prior to the visit, additional usage of staff PPE, and extensive cleaning of exam room while observing appropriate contact time as indicated for disinfecting solutions.

## 2019-09-11 NOTE — Patient Instructions (Signed)
Health Maintenance Due  Topic Date Due  . HIV Screening  11/04/2007    Depression screen PHQ 2/9 09/27/2018  Decreased Interest 3  Down, Depressed, Hopeless 3  PHQ - 2 Score 6  Altered sleeping 3  Tired, decreased energy 2  Change in appetite 2  Feeling bad or failure about yourself  1  Trouble concentrating 3  Moving slowly or fidgety/restless 0  Suicidal thoughts 0  PHQ-9 Score 17  Difficult doing work/chores Not difficult at all

## 2019-09-12 ENCOUNTER — Encounter: Payer: Self-pay | Admitting: Family Medicine

## 2019-09-12 ENCOUNTER — Other Ambulatory Visit: Payer: Self-pay

## 2019-09-12 ENCOUNTER — Other Ambulatory Visit: Payer: Self-pay | Admitting: Family Medicine

## 2019-09-12 DIAGNOSIS — L680 Hirsutism: Secondary | ICD-10-CM

## 2019-09-12 DIAGNOSIS — J3089 Other allergic rhinitis: Secondary | ICD-10-CM | POA: Diagnosis not present

## 2019-09-12 DIAGNOSIS — E282 Polycystic ovarian syndrome: Secondary | ICD-10-CM

## 2019-09-12 DIAGNOSIS — E559 Vitamin D deficiency, unspecified: Secondary | ICD-10-CM

## 2019-09-12 DIAGNOSIS — J301 Allergic rhinitis due to pollen: Secondary | ICD-10-CM | POA: Diagnosis not present

## 2019-09-12 MED ORDER — METFORMIN HCL ER 500 MG PO TB24: ORAL_TABLET | ORAL | 1 refills | Status: DC

## 2019-09-12 MED ORDER — VITAMIN D (ERGOCALCIFEROL) 1.25 MG (50000 UNIT) PO CAPS: 50000.0000 [IU] | ORAL_CAPSULE | ORAL | 3 refills | Status: AC

## 2019-09-12 MED ORDER — SPIRONOLACTONE 100 MG PO TABS: 100.0000 mg | ORAL_TABLET | Freq: Every day | ORAL | 1 refills | Status: DC

## 2019-09-12 MED ORDER — VITAMIN D (ERGOCALCIFEROL) 1.25 MG (50000 UNIT) PO CAPS: 50000.0000 [IU] | ORAL_CAPSULE | ORAL | 3 refills | Status: DC

## 2019-09-12 MED ORDER — SPIRONOLACTONE 100 MG PO TABS: 100.0000 mg | ORAL_TABLET | Freq: Every day | ORAL | 1 refills | Status: AC

## 2019-09-12 MED ORDER — METFORMIN HCL ER 500 MG PO TB24: ORAL_TABLET | ORAL | 1 refills | Status: AC

## 2019-09-13 ENCOUNTER — Encounter (INDEPENDENT_AMBULATORY_CARE_PROVIDER_SITE_OTHER): Payer: Self-pay | Admitting: Family Medicine

## 2019-09-13 MED FILL — SPIRONOLACTONE 100 MG TAB: 100 | 90 days supply | Qty: 90 | Fill #0

## 2019-09-13 MED FILL — metFORMIN HCL ER 500 MG TB2: 500 | 90 days supply | Qty: 256 | Fill #0

## 2019-09-13 MED FILL — VIT D2 1.25 MG (50,000 UNIT: 1.25 MG | 28 days supply | Qty: 4 | Fill #0

## 2019-09-17 NOTE — Telephone Encounter (Signed)
I have this patient scheduled

## 2019-09-17 NOTE — Telephone Encounter (Signed)
Please review

## 2019-09-18 ENCOUNTER — Encounter: Payer: 59 | Admitting: Family Medicine

## 2019-09-19 ENCOUNTER — Encounter (INDEPENDENT_AMBULATORY_CARE_PROVIDER_SITE_OTHER): Payer: Self-pay

## 2019-09-19 DIAGNOSIS — J3089 Other allergic rhinitis: Secondary | ICD-10-CM | POA: Diagnosis not present

## 2019-09-19 DIAGNOSIS — J301 Allergic rhinitis due to pollen: Secondary | ICD-10-CM | POA: Diagnosis not present

## 2019-09-20 ENCOUNTER — Telehealth: Payer: 59 | Admitting: Nurse Practitioner

## 2019-09-20 DIAGNOSIS — J0100 Acute maxillary sinusitis, unspecified: Secondary | ICD-10-CM

## 2019-09-20 MED ORDER — AMOXICILLIN-POT CLAVULANATE 875-125 MG PO TABS: 1.0000 | ORAL_TABLET | Freq: Two times a day (BID) | ORAL | 0 refills | Status: DC

## 2019-09-20 NOTE — Progress Notes (Signed)

## 2019-09-26 DIAGNOSIS — Z03818 Encounter for observation for suspected exposure to other biological agents ruled out: Secondary | ICD-10-CM | POA: Diagnosis not present

## 2019-09-26 DIAGNOSIS — J00 Acute nasopharyngitis [common cold]: Secondary | ICD-10-CM | POA: Diagnosis not present

## 2019-09-26 DIAGNOSIS — R05 Cough: Secondary | ICD-10-CM | POA: Diagnosis not present

## 2019-09-30 ENCOUNTER — Ambulatory Visit (INDEPENDENT_AMBULATORY_CARE_PROVIDER_SITE_OTHER): Payer: 59 | Admitting: Family Medicine

## 2019-09-30 DIAGNOSIS — R05 Cough: Secondary | ICD-10-CM | POA: Diagnosis not present

## 2019-09-30 DIAGNOSIS — Z03818 Encounter for observation for suspected exposure to other biological agents ruled out: Secondary | ICD-10-CM | POA: Diagnosis not present

## 2019-10-01 ENCOUNTER — Other Ambulatory Visit: Payer: Self-pay

## 2019-10-02 ENCOUNTER — Encounter: Payer: Self-pay | Admitting: Family Medicine

## 2019-10-02 ENCOUNTER — Ambulatory Visit (INDEPENDENT_AMBULATORY_CARE_PROVIDER_SITE_OTHER): Payer: 59 | Admitting: Family Medicine

## 2019-10-02 VITALS — BP 126/78 | HR 90 | Temp 98.0°F | Ht 66.0 in | Wt 302.2 lb

## 2019-10-02 DIAGNOSIS — Z0000 Encounter for general adult medical examination without abnormal findings: Secondary | ICD-10-CM | POA: Diagnosis not present

## 2019-10-02 DIAGNOSIS — E559 Vitamin D deficiency, unspecified: Secondary | ICD-10-CM

## 2019-10-02 DIAGNOSIS — E282 Polycystic ovarian syndrome: Secondary | ICD-10-CM | POA: Diagnosis not present

## 2019-10-02 NOTE — Progress Notes (Signed)
Tamara Diaz is a 29 y.o. female  Chief Complaint  Patient presents with  . Annual Exam    pt here for CPE, concerns about medications patient states that since starting Metformin she does not have much of an appetite.     HPI: Tamara Diaz is a 29 y.o. female here for CPE. She had fasting labs done a few weeks ago and these were previously reviewed with patient. She is UTD on PAP. She recently started taking metformin, spironolactone in addition to her OCPs for PCOS. Pt has lost 5lbs and endorses decreased appetite since starting metformin.  Last PAP: 11/2018  Med refills needed today? none   Past Medical History:  Diagnosis Date  . Anxiety   . Back pain   . Depression   . Environmental allergies   . Fatigue   . Lactose intolerance   . Migraines   . Prediabetes   . Vitamin D deficiency     Past Surgical History:  Procedure Laterality Date  . APPENDECTOMY    . CYSTECTOMY    . TONSILLECTOMY AND ADENOIDECTOMY      Social History   Socioeconomic History  . Marital status: Single    Spouse name: Not on file  . Number of children: 0  . Years of education: Not on file  . Highest education level: Not on file  Occupational History  . Occupation: Development worker, international aid: [employer]  Tobacco Use  . Smoking status: Never Smoker  . Smokeless tobacco: Never Used  Substance and Sexual Activity  . Alcohol use: Yes    Comment: socially  . Drug use: Never  . Sexual activity: Not Currently    Comment: 1st intercourse 29 yo-More than 5 partners  Other Topics Concern  . Not on file  Social History Narrative  . Not on file   Social Determinants of Health   Financial Resource Strain:   . Difficulty of Paying Living Expenses: Not on file  Food Insecurity:   . Worried About Charity fundraiser in the Last Year: Not on file  . Ran Out of Food in the Last Year: Not on file  Transportation Needs:   . Lack of Transportation (Medical): Not on file  . Lack  of Transportation (Non-Medical): Not on file  Physical Activity:   . Days of Exercise per Week: Not on file  . Minutes of Exercise per Session: Not on file  Stress:   . Feeling of Stress : Not on file  Social Connections:   . Frequency of Communication with Friends and Family: Not on file  . Frequency of Social Gatherings with Friends and Family: Not on file  . Attends Religious Services: Not on file  . Active Member of Clubs or Organizations: Not on file  . Attends Archivist Meetings: Not on file  . Marital Status: Not on file  Intimate Partner Violence:   . Fear of Current or Ex-Partner: Not on file  . Emotionally Abused: Not on file  . Physically Abused: Not on file  . Sexually Abused: Not on file    Family History  Problem Relation Age of Onset  . Diabetes Father   . Hypertension Father   . Heart disease Father   . Depression Father   . Anxiety disorder Father   . Bipolar disorder Father   . Obesity Father       There is no immunization history on file for this patient.  Outpatient Encounter Medications  as of 10/02/2019  Medication Sig  . albuterol (VENTOLIN HFA) 108 (90 Base) MCG/ACT inhaler Inhale 2 puffs into the lungs every 4 (four) hours as needed for shortness of breath.  . cetirizine (ZYRTEC) 10 MG tablet Take 10 mg by mouth daily.  . cholecalciferol (VITAMIN D3) 25 MCG (1000 UNIT) tablet Take 1,000 Units by mouth daily.  . fluticasone (FLONASE) 50 MCG/ACT nasal spray Place 2 sprays into both nostrils daily.  Marland Kitchen KAITLIB FE 0.8-25 MG-MCG tablet   . Melatonin 5 MG TABS Take 1 tablet by mouth at bedtime.  . metFORMIN (GLUCOPHAGE XR) 500 MG 24 hr tablet 2 tabs po qPM x 2 weeks then 1 tab po qAM and 2 tabs po qPM  . montelukast (SINGULAIR) 10 MG tablet Take 10 mg by mouth at bedtime.  Marland Kitchen spironolactone (ALDACTONE) 100 MG tablet Take 1 tablet (100 mg total) by mouth daily.  . traZODone (DESYREL) 50 MG tablet Take 50 mg by mouth at bedtime.  Marland Kitchen  amoxicillin-clavulanate (AUGMENTIN) 875-125 MG tablet Take 1 tablet by mouth 2 (two) times daily. (Patient not taking: Reported on 10/02/2019)  . ipratropium (ATROVENT) 0.03 % nasal spray Place 2 sprays into both nostrils every 12 (twelve) hours. (Patient not taking: Reported on 10/02/2019)  . Vitamin D, Ergocalciferol, (DRISDOL) 1.25 MG (50000 UNIT) CAPS capsule Take 1 capsule (50,000 Units total) by mouth every 7 (seven) days. (Patient not taking: Reported on 10/02/2019)   No facility-administered encounter medications on file as of 10/02/2019.     ROS: Gen: no fever, chills  Skin: no rash, itching ENT: no ear pain, ear drainage, nasal congestion, rhinorrhea, sinus pressure, sore throat Eyes: no blurry vision, double vision Resp: no cough, wheeze,SOB Breast: no breast tenderness, no nipple discharge, no breast masses CV: no CP, palpitations, LE edema,  GI: no heartburn, n/v/d/c, abd pain GU: no dysuria, urgency, frequency, hematuria; no vaginal itching, odor, discharge MSK: no joint pain, myalgias, back pain Neuro: no dizziness, headache, weakness, vertigo Psych: no depression, anxiety, insomnia   Allergies  Allergen Reactions  . Codeine     insomnia   . Lactose Intolerance (Gi) Diarrhea  . Zinc Oxide Rash    BP 126/78   Pulse 90   Temp 98 F (36.7 C) (Tympanic)   Ht 5\' 6"  (1.676 m)   Wt (!) 302 lb 3.2 oz (137.1 kg)   SpO2 96%   BMI 48.78 kg/m   Physical Exam  Constitutional: She is oriented to person, place, and time. She appears well-developed and well-nourished. No distress.  Morbidly obese  HENT:  Head: Normocephalic and atraumatic.  Right Ear: Tympanic membrane and ear canal normal.  Left Ear: Tympanic membrane and ear canal normal.  Nose: Nose normal.  Mouth/Throat: Oropharynx is clear and moist and mucous membranes are normal.  Eyes: Pupils are equal, round, and reactive to light. Conjunctivae are normal.  Neck: No thyromegaly present.  Cardiovascular: Normal  rate, regular rhythm, normal heart sounds and intact distal pulses.  No murmur heard. Pulmonary/Chest: Effort normal and breath sounds normal. No respiratory distress. She has no wheezes. She has no rhonchi.  Abdominal: Soft. Bowel sounds are normal. She exhibits no distension and no mass. There is no abdominal tenderness.  Musculoskeletal:        General: No edema.     Cervical back: Neck supple.  Lymphadenopathy:    She has no cervical adenopathy.  Neurological: She is alert and oriented to person, place, and time. She exhibits normal muscle tone. Coordination  normal.  Skin: Skin is warm and dry.  Facial acne  Psychiatric: She has a normal mood and affect. Her behavior is normal.     A/P:  1. Annual physical exam - PAP UTD  - labs done previously and reviewed with pt - UTD on dental - immunizations UTD - discussed importance of regular CV exercise, healthy diet, adequate sleep. Pt has lost 5lbs since last OV so congratulated her on this - next CPE in 1 year  2. PCOS (polycystic ovarian syndrome) - just recently started on metformin, spironolactone in addition to OCPs, cont these  3. Vitamin D deficiency - pt taking Vit D 50,000IU weekly  This visit occurred during the SARS-CoV-2 public health emergency.  Safety protocols were in place, including screening questions prior to the visit, additional usage of staff PPE, and extensive cleaning of exam room while observing appropriate contact time as indicated for disinfecting solutions.

## 2019-10-02 NOTE — Patient Instructions (Signed)
Health Maintenance, Female Adopting a healthy lifestyle and getting preventive care are important in promoting health and wellness. Ask your health care provider about:  The right schedule for you to have regular tests and exams.  Things you can do on your own to prevent diseases and keep yourself healthy. What should I know about diet, weight, and exercise? Eat a healthy diet   Eat a diet that includes plenty of vegetables, fruits, low-fat dairy products, and lean protein.  Do not eat a lot of foods that are high in solid fats, added sugars, or sodium. Maintain a healthy weight Body mass index (BMI) is used to identify weight problems. It estimates body fat based on height and weight. Your health care provider can help determine your BMI and help you achieve or maintain a healthy weight. Get regular exercise Get regular exercise. This is one of the most important things you can do for your health. Most adults should:  Exercise for at least 150 minutes each week. The exercise should increase your heart rate and make you sweat (moderate-intensity exercise).  Do strengthening exercises at least twice a week. This is in addition to the moderate-intensity exercise.  Spend less time sitting. Even light physical activity can be beneficial. Watch cholesterol and blood lipids Have your blood tested for lipids and cholesterol at 27 years of age, then have this test every 5 years. Have your cholesterol levels checked more often if:  Your lipid or cholesterol levels are high.  You are older than 27 years of age.  You are at high risk for heart disease. What should I know about cancer screening? Depending on your health history and family history, you may need to have cancer screening at various ages. This may include screening for:  Breast cancer.  Cervical cancer.  Colorectal cancer.  Skin cancer.  Lung cancer. What should I know about heart disease, diabetes, and high blood  pressure? Blood pressure and heart disease  High blood pressure causes heart disease and increases the risk of stroke. This is more likely to develop in people who have high blood pressure readings, are of African descent, or are overweight.  Have your blood pressure checked: ? Every 3-5 years if you are 18-39 years of age. ? Every year if you are 40 years old or older. Diabetes Have regular diabetes screenings. This checks your fasting blood sugar level. Have the screening done:  Once every three years after age 40 if you are at a normal weight and have a low risk for diabetes.  More often and at a younger age if you are overweight or have a high risk for diabetes. What should I know about preventing infection? Hepatitis B If you have a higher risk for hepatitis B, you should be screened for this virus. Talk with your health care provider to find out if you are at risk for hepatitis B infection. Hepatitis C Testing is recommended for:  Everyone born from 1945 through 1965.  Anyone with known risk factors for hepatitis C. Sexually transmitted infections (STIs)  Get screened for STIs, including gonorrhea and chlamydia, if: ? You are sexually active and are younger than 27 years of age. ? You are older than 27 years of age and your health care provider tells you that you are at risk for this type of infection. ? Your sexual activity has changed since you were last screened, and you are at increased risk for chlamydia or gonorrhea. Ask your health care provider if   you are at risk.  Ask your health care provider about whether you are at high risk for HIV. Your health care provider may recommend a prescription medicine to help prevent HIV infection. If you choose to take medicine to prevent HIV, you should first get tested for HIV. You should then be tested every 3 months for as long as you are taking the medicine. Pregnancy  If you are about to stop having your period (premenopausal) and  you may become pregnant, seek counseling before you get pregnant.  Take 400 to 800 micrograms (mcg) of folic acid every day if you become pregnant.  Ask for birth control (contraception) if you want to prevent pregnancy. Osteoporosis and menopause Osteoporosis is a disease in which the bones lose minerals and strength with aging. This can result in bone fractures. If you are 65 years old or older, or if you are at risk for osteoporosis and fractures, ask your health care provider if you should:  Be screened for bone loss.  Take a calcium or vitamin D supplement to lower your risk of fractures.  Be given hormone replacement therapy (HRT) to treat symptoms of menopause. Follow these instructions at home: Lifestyle  Do not use any products that contain nicotine or tobacco, such as cigarettes, e-cigarettes, and chewing tobacco. If you need help quitting, ask your health care provider.  Do not use street drugs.  Do not share needles.  Ask your health care provider for help if you need support or information about quitting drugs. Alcohol use  Do not drink alcohol if: ? Your health care provider tells you not to drink. ? You are pregnant, may be pregnant, or are planning to become pregnant.  If you drink alcohol: ? Limit how much you use to 0-1 drink a day. ? Limit intake if you are breastfeeding.  Be aware of how much alcohol is in your drink. In the [place] one drink equals one 12 oz bottle of beer (355 mL), one 5 oz glass of wine (148 mL), or one 1 oz glass of hard liquor (44 mL). General instructions  Schedule regular health, dental, and eye exams.  Stay current with your vaccines.  Tell your health care provider if: ? You often feel depressed. ? You have ever been abused or do not feel safe at home. Summary  Adopting a healthy lifestyle and getting preventive care are important in promoting health and wellness.  Follow your health care provider's instructions about healthy  diet, exercising, and getting tested or screened for diseases.  Follow your health care provider's instructions on monitoring your cholesterol and blood pressure. This information is not intended to replace advice given to you by your health care provider. Make sure you discuss any questions you have with your health care provider. Document Revised: 07/18/2018 Document Reviewed: 07/18/2018 Elsevier Patient Education  2020 Else[employer] Inc[employer]  

## 2019-10-05 MED FILL — VIT D2 1.25 MG (50,000 UNIT: 1.25 MG | 28 days supply | Qty: 4 | Fill #1

## 2019-10-09 MED FILL — MONTELUKAST SOD 10 MG TAB: 10 | 90 days supply | Qty: 90 | Fill #3

## 2019-10-14 ENCOUNTER — Encounter (INDEPENDENT_AMBULATORY_CARE_PROVIDER_SITE_OTHER): Payer: Self-pay | Admitting: Family Medicine

## 2019-10-14 ENCOUNTER — Other Ambulatory Visit: Payer: Self-pay

## 2019-10-14 ENCOUNTER — Ambulatory Visit (INDEPENDENT_AMBULATORY_CARE_PROVIDER_SITE_OTHER): Payer: 59 | Admitting: Family Medicine

## 2019-10-14 VITALS — BP 128/82 | HR 91 | Temp 97.8°F | Ht 67.0 in | Wt 296.0 lb

## 2019-10-14 DIAGNOSIS — Z6841 Body Mass Index (BMI) 40.0 and over, adult: Secondary | ICD-10-CM

## 2019-10-14 DIAGNOSIS — Z0289 Encounter for other administrative examinations: Secondary | ICD-10-CM

## 2019-10-14 DIAGNOSIS — R0602 Shortness of breath: Secondary | ICD-10-CM

## 2019-10-14 DIAGNOSIS — F419 Anxiety disorder, unspecified: Secondary | ICD-10-CM | POA: Diagnosis not present

## 2019-10-14 DIAGNOSIS — R7303 Prediabetes: Secondary | ICD-10-CM | POA: Diagnosis not present

## 2019-10-14 DIAGNOSIS — R5383 Other fatigue: Secondary | ICD-10-CM

## 2019-10-14 DIAGNOSIS — Z9189 Other specified personal risk factors, not elsewhere classified: Secondary | ICD-10-CM

## 2019-10-14 DIAGNOSIS — E559 Vitamin D deficiency, unspecified: Secondary | ICD-10-CM

## 2019-10-14 DIAGNOSIS — R7989 Other specified abnormal findings of blood chemistry: Secondary | ICD-10-CM

## 2019-10-14 DIAGNOSIS — F3289 Other specified depressive episodes: Secondary | ICD-10-CM

## 2019-10-14 NOTE — Progress Notes (Signed)
Chief Complaint:   OBESITY Tamara Diaz (MR# [id number]) is a 29 y.o. female who presents for evaluation and treatment of obesity and related comorbidities. Current BMI is Body mass index is 46.36 kg/m.Marland Kitchen Tamara Diaz has been struggling with her weight for many years and has been unsuccessful in either losing weight, maintaining weight loss, or reaching her healthy weight goal.  Tamara Diaz is currently in the action stage of change and ready to dedicate time achieving and maintaining a healthier weight. Tamara Diaz is a returning patient, and she has not been seen since June 2020. She is interested in working on intensive lifestyle modifications including (but not limited to) diet and exercise for weight loss. 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  Tamara Diaz's habits were reviewed today and are as follows: Her family eats meals together, she thinks her family will eat healthier with her, her desired weight loss is 146 pounds, she has been heavy most of her life, she started gaining weight in her early 20's, her heaviest weight ever was 319 pounds, she has significant food cravings issues, she is trying to follow a vegetarian diet, she is trying to follow a vegan diet, she is frequently drinking liquids with calories, she frequently makes poor food choices, she sometimes eats larger portions than normal, she has binge eating behaviors and she struggles with emotional eating.  Breakfast consists of a banana with hot tea. Lunch consists of grilled chicken with strawberry and blueberry with pecans, salad. Dinner consists of 1 chicken breast with peppers and onions with green beans.  Depression Screen Tamara Diaz's Food and Mood (modified PHQ-9) score was moderately positive. PHQ 9 Score is 10  Depression screen PHQ 2/9 10/14/2019  Decreased Interest 1  Down, Depressed, Hopeless 1  PHQ - 2 Score 2  Altered sleeping 2  Tired, decreased energy 0  Change in appetite 1  Feeling bad or failure  about yourself  3  Trouble concentrating 2  Moving slowly or fidgety/restless 0  Suicidal thoughts 0  PHQ-9 Score 10  Difficult doing work/chores Somewhat difficult   Subjective:   Other fatigue  Tamara Diaz admits to daytime somnolence and denies waking up still tired. Tamara Diaz has a history of symptoms of daytime fatigue and morning headache. Tamara Diaz generally gets 7 or 8 hours of sleep per night, and states that she has generally restful sleep. Snoring is not present. Apneic episodes are not present. Epworth Sleepiness Score is 4. EKG done 10/14/2019 shows normal sinus rhythm at 89 BPM.   Shortness of breath on exertion Tamara Diaz notes increasing shortness of breath with exercising and seems to be worsening over time with weight gain. She notes getting out of breath sooner with activity than she used to. This has not gotten worse recently. Tamara Diaz denies shortness of breath at rest or orthopnea. EKG done 10/14/2019 shows normal sinus rhythm at 89 BPM.   Prediabetes Tamara Diaz has a diagnosis of prediabetes based on her elevated Hgb A1c and was informed this puts her at greater risk of developing diabetes. She has Hgb A1c of 5.6 and insulin level of 44.75. She is on metformin 500 mg twice daily. She is working on diet and exercise to decrease her risk of diabetes.  Lab Results  Component Value Date   HGBA1C 5.6 09/11/2019   Vitamin D Deficiency Tamara Diaz's Vitamin D level was 10.31 on 09/11/19. She is currently taking prescription vit D 50,000 IU weekly. She admits fatigue and denies nausea, vomiting or muscle weakness.  Anxiety Tamara Diaz  is seeing a Veterinary surgeon. She was previously on Zoloft. Tamara Diaz is using Trazodone nightly.  Other depression, with emotional eating Tamara Diaz is struggling with emotional eating and using food for comfort to the extent that it is negatively impacting her health. She is not on medications. Tamara Diaz is attempting to work on behavior modification techniques to help reduce her  emotional eating. She shows no sign of suicidal or homicidal ideations.  At risk for diabetes mellitus Tamara Diaz is at higher than average risk for developing diabetes due to her obesity and prediabetes.   Assessment/Plan:   Other fatigue Tamara Diaz does feel that her weight is causing her energy to be lower than it should be. Fatigue may be related to obesity, depression or many other causes. Labs and EKG will be ordered, and in the meanwhile, Tamara Diaz will focus on self care including making healthy food choices, increasing physical activity and focusing on stress reduction.  Shortness of breath on exertion Tamara Diaz does feel that she gets out of breath more easily that she used to when she exercises. Tamara Diaz's shortness of breath appears to be obesity related and exercise induced. She has agreed to work on weight loss and gradually increase exercise to treat her exercise induced shortness of breath. Labs, EKG and indirect calorimetry will be ordered. We will continue to monitor closely.  Prediabetes Tamara Diaz will work on weight loss, exercise, and decreasing simple carbohydrates to help decrease the risk of diabetes. We will follow up labs in 3 months.  Vitamin D Deficiency Low Vitamin D level contributes to fatigue and are associated with obesity, breast, and colon cancer. Tamara Diaz will continue to take prescription Vitamin D @50 ,000 IU every week and she will follow-up for routine testing of Vitamin D, at least 2-3 times per year to avoid over-replacement.  Anxiety Tamara Diaz will follow up with her counselor.  Other depression, with emotional eating Behavior modification techniques were discussed today to help Tamara Diaz deal with her emotional/non-hunger eating behaviors.  We will refer patient to Dr. Luther Parody. Orders and follow up as documented in patient record.   At risk for diabetes mellitus Tamara Diaz was given approximately 15 minutes of diabetes education and counseling today. We discussed  intensive lifestyle modifications today with an emphasis on weight loss as well as increasing exercise and decreasing simple carbohydrates in her diet. We also reviewed medication options with an emphasis on risk versus benefit of those discussed.   Repetitive spaced learning was employed today to elicit superior memory formation and behavioral change.  Obesity Tamara Diaz is currently in the action stage of change and her goal is to continue with weight loss efforts. I recommend Tamara Diaz begin the structured treatment plan as follows:  She has agreed to the Category 4 Plan.  Behavioral modification strategies: increasing lean protein intake, increasing vegetables, meal planning and cooking strategies, keeping healthy foods in the home and planning for success.  She was informed of the importance of frequent follow-up visits to maximize her success with intensive lifestyle modifications for her multiple health conditions. She was informed we would discuss her lab results at her next visit unless there is a critical issue that needs to be addressed sooner. Tamara Diaz agreed to keep her next visit at the agreed upon time to discuss these results.  Objective:   Blood pressure 128/82, pulse 91, temperature 97.8 F (36.6 C), temperature source Oral, height 5\' 7"  (1.702 m), weight 296 lb (134.3 kg), last menstrual period 08/10/2019, SpO2 95 %. Body mass index is 46.36 kg/m.  EKG:  Normal sinus rhythm, rate 89 BPM.  Indirect Calorimeter completed today shows a VO2 of 371 and a REE of 2584.  Her calculated basal metabolic rate is 6269 thus her basal metabolic rate is better than expected.  General: Cooperative, alert, well developed, in no acute distress. HEENT: Conjunctivae and lids unremarkable. Cardiovascular: Regular rhythm.  Lungs: Normal work of breathing. Neurologic: No focal deficits.   Lab Results  Component Value Date   CREATININE 0.67 09/11/2019   BUN 9 09/11/2019   NA 142 09/11/2019   K  4.3 09/11/2019   CL 105 09/11/2019   CO2 28 09/11/2019   Lab Results  Component Value Date   ALT 46 (H) 09/11/2019   AST 58 (H) 09/11/2019   ALKPHOS 55 01/09/2012   BILITOT 0.2 (L) 01/09/2012   Lab Results  Component Value Date   HGBA1C 5.6 09/11/2019   No results found for: INSULIN Lab Results  Component Value Date   TSH 1.57 09/11/2019   Lab Results  Component Value Date   CHOL 221 (H) 09/11/2019   HDL 32.60 (L) 09/11/2019   LDLCALC 158 (H) 09/11/2019   TRIG 151.0 (H) 09/11/2019   CHOLHDL 7 09/11/2019   Lab Results  Component Value Date   WBC 7.4 09/11/2019   HGB 14.4 09/11/2019   HCT 42.0 09/11/2019   MCV 85.5 09/11/2019   PLT 315.0 09/11/2019   No results found for: IRON, TIBC, FERRITIN   Ref. Range 09/11/2019 09:14  VITD Latest Ref Range: 30.00 - 100.00 ng/mL 10.31 (L)    Attestation Statements:   Reviewed by clinician on day of visit: allergies, medications, problem list, medical history, surgical history, family history, social history, and previous encounter notes.  I, Nevada Crane, am acting as transcriptionist for Reuben Likes, MD.  I have reviewed the above documentation for accuracy and completeness, and I agree with the above. - Debbra Riding, MD
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
## 2019-10-15 ENCOUNTER — Encounter (INDEPENDENT_AMBULATORY_CARE_PROVIDER_SITE_OTHER): Payer: Self-pay | Admitting: Family Medicine

## 2019-10-15 LAB — FOLATE: Folate: 3 ng/mL — ABNORMAL LOW (ref >3.0)

## 2019-10-15 LAB — T4, FREE: Free T4: 1.17 ng/dL (ref 0.82–1.77)

## 2019-10-15 LAB — VITAMIN B12: Vitamin B-12: 329 pg/mL (ref 232–1245)

## 2019-10-15 LAB — T3: T3, Total: 289 ng/dL — ABNORMAL HIGH (ref 71–180)

## 2019-10-15 NOTE — Telephone Encounter (Signed)
Please advise 

## 2019-10-16 ENCOUNTER — Encounter: Payer: Self-pay | Admitting: Family Medicine

## 2019-10-16 ENCOUNTER — Other Ambulatory Visit: Payer: Self-pay

## 2019-10-16 ENCOUNTER — Ambulatory Visit (INDEPENDENT_AMBULATORY_CARE_PROVIDER_SITE_OTHER): Payer: 59 | Admitting: Psychology

## 2019-10-16 DIAGNOSIS — F3289 Other specified depressive episodes: Secondary | ICD-10-CM | POA: Diagnosis not present

## 2019-10-16 DIAGNOSIS — F5089 Other specified eating disorder: Secondary | ICD-10-CM

## 2019-10-17 DIAGNOSIS — J3089 Other allergic rhinitis: Secondary | ICD-10-CM | POA: Diagnosis not present

## 2019-10-17 DIAGNOSIS — J301 Allergic rhinitis due to pollen: Secondary | ICD-10-CM | POA: Diagnosis not present

## 2019-10-17 NOTE — Telephone Encounter (Signed)
Please see message and advise.  Thank you. ° °

## 2019-10-17 NOTE — Progress Notes (Signed)
  Office: [phone]  /  Fax: [phone]    Date: October 31, 2019   Appointment Start Time: 10:30am Duration: 26 minutes Provider: Lawerance Cruel, Psy.D. Type of Session: Individual Therapy  Location of Patient: Home Location of Provider: Provider's Home Type of Contact: Telepsychological Visit via [employer] Web[employer]  Session Content: Arnisha is a 29 y.o. female presenting via [employer] Web[employer] for a follow-up appointment to address the previously established treatment goal of increasing coping skills. Today's appointment was a telepsychological visit due to COVID-19. Lizzeth provided verbal consent for today's telepsychological appointment and she is aware she is responsible for securing confidentiality on her end of the session. Prior to proceeding with today's appointment, Shantina's physical location at the time of this appointment was obtained as well a phone number she could be reached at in the event of technical difficulties. Halla and this provider participated in today's telepsychological service. Of note, due to audio issues on Webex, this provider called Yailene at 10:34am. As such, today's appointment proceeded with Webex for video capabilities and a regular telephone call for audio.   This provider conducted a brief check-in. Raneen shared she reviewed the handout previously shared and discussed focusing on a routine. She noted recent weight loss. Positive reinforcement was provided. Additionally, psychoeducation regarding triggers for emotional eating was provided. Coco was provided a handout, and encouraged to utilize the handout between now and the next appointment to increase awareness of triggers and frequency. Angeletta agreed. This provider also discussed behavioral strategies for specific triggers, such as placing the utensil down when conversing to avoid mindless eating. Vaani provided verbal consent during today's appointment for this provider to send a handout about triggers via  e-mail. Viriginia was receptive to today's appointment as evidenced by openness to sharing, responsiveness to feedback, and willingness to explore triggers for emotional eating.  Mental Status Examination:  Appearance: well groomed and appropriate hygiene  Behavior: appropriate to circumstances Mood: euthymic Affect: mood congruent Speech: normal in rate, volume, and tone Eye Contact: appropriate Psychomotor Activity: appropriate Gait: unable to assess Thought Process: linear, logical, and goal directed  Thought Content/Perception: no hallucinations, delusions, bizarre thinking or behavior reported or observed and no evidence of suicidal and homicidal ideation, plan, and intent Orientation: time, person, place and purpose of appointment Memory/Concentration: memory, attention, language, and fund of knowledge intact  Insight/Judgment: good  Interventions:  Conducted a brief chart review Provided empathic reflections and validation Reviewed content from the previous session Employed supportive psychotherapy interventions to facilitate reduced distress and to improve coping skills with identified stressors Employed motivational interviewing skills to assess patient's willingness/desire to adhere to recommended medical treatments and assignments Psychoeducation provided regarding triggers for emotional eating  DSM-5 Diagnosis(es): 311 (F32.8) Other Specified Depressive Disorder, Emotional Eating Behaviors  Treatment Goal & Progress: During the initial appointment with this provider, the following treatment goal was established: increasing coping skills. Asucena has demonstrated progress in her goal as evidenced by increased awareness of hunger patterns.   Plan: The next appointment will be scheduled in approximately two weeks, which will be via [employer]. The next session will focus on working towards the established treatment goal.

## 2019-10-18 ENCOUNTER — Telehealth: Payer: 59 | Admitting: Nurse Practitioner

## 2019-10-18 DIAGNOSIS — N3000 Acute cystitis without hematuria: Secondary | ICD-10-CM | POA: Diagnosis not present

## 2019-10-18 MED ORDER — NITROFURANTOIN MONOHYD MACRO 100 MG PO CAPS: 100.0000 mg | ORAL_CAPSULE | Freq: Two times a day (BID) | ORAL | 0 refills | Status: DC

## 2019-10-18 MED FILL — VIT D2 1.25 MG (50,000 UNIT: 1.25 MG | 28 days supply | Qty: 4 | Fill #1

## 2019-10-18 NOTE — Progress Notes (Signed)

## 2019-10-22 DIAGNOSIS — J3089 Other allergic rhinitis: Secondary | ICD-10-CM | POA: Diagnosis not present

## 2019-10-22 DIAGNOSIS — J301 Allergic rhinitis due to pollen: Secondary | ICD-10-CM | POA: Diagnosis not present

## 2019-10-28 ENCOUNTER — Encounter (INDEPENDENT_AMBULATORY_CARE_PROVIDER_SITE_OTHER): Payer: Self-pay | Admitting: Family Medicine

## 2019-10-28 ENCOUNTER — Other Ambulatory Visit: Payer: Self-pay

## 2019-10-28 ENCOUNTER — Ambulatory Visit (INDEPENDENT_AMBULATORY_CARE_PROVIDER_SITE_OTHER): Payer: 59 | Admitting: Family Medicine

## 2019-10-28 VITALS — BP 126/85 | HR 94 | Temp 98.1°F | Ht 67.0 in | Wt 293.0 lb

## 2019-10-28 DIAGNOSIS — R7303 Prediabetes: Secondary | ICD-10-CM

## 2019-10-28 DIAGNOSIS — R7989 Other specified abnormal findings of blood chemistry: Secondary | ICD-10-CM | POA: Diagnosis not present

## 2019-10-28 DIAGNOSIS — E538 Deficiency of other specified B group vitamins: Secondary | ICD-10-CM

## 2019-10-28 DIAGNOSIS — Z6841 Body Mass Index (BMI) 40.0 and over, adult: Secondary | ICD-10-CM

## 2019-10-28 DIAGNOSIS — E559 Vitamin D deficiency, unspecified: Secondary | ICD-10-CM

## 2019-10-28 NOTE — Progress Notes (Signed)
Chief Complaint:   OBESITY Tamara Diaz is here to discuss her progress with her obesity treatment plan along with follow-up of her obesity related diagnoses. Tamara Diaz is on the Category 4 Plan and states she is following her eating plan approximately 80-90% of the time. Tamara Diaz states she is walking the dogs for 30 minutes 4-5 times per week.  Today's visit was #: 2 Starting weight: 296 lbs Starting date: 10/14/2019 Today's weight: 293 lbs Today's date: 10/28/2019 Total lbs lost to date: 3 lbs Total lbs lost since last in-office visit: 3 lbs  Interim History: Tamara Diaz has been spacing out all her food throughout the day.  She had increased anxiety about her thyroid function tests.  For breakfast, she had 2 eggs, [place] bacon or sausage.  Able to get in 8-10 ounces of meat at dinner.  Doing beef jerky, pears/apples, Skinny Pop for snacks.  She is going away for a few days for her birthday.   Subjective:   1. Vitamin D deficiency Tamara Diaz's Vitamin D level was 10.31 on 09/11/2019. She is currently taking vit D. She denies nausea, vomiting or muscle weakness.  She endorses fatigue.  2. Prediabetes Tamara Diaz has a diagnosis of prediabetes based on her elevated HgA1c and was informed this puts her at greater risk of developing diabetes. She continues to work on diet and exercise to decrease her risk of diabetes. She denies nausea or hypoglycemia.  She is taking metformin 500 mg in the morning and 1000 mg at night.  She still has occasional carb cravings.  Lab Results  Component Value Date   HGBA1C 5.6 09/11/2019   3. Folate deficiency Last folate was less than 3.0.  She endorses fatigue.  She is not taking a multivitain.  4. Elevated LFTs CT scan in 2013 showing diffuse fatty infiltrates.  Previously LFTs were within normal limits.  Lab Results  Component Value Date   ALT 46 (H) 09/11/2019   AST 58 (H) 09/11/2019   ALKPHOS 55 01/09/2012   BILITOT 0.2 (L) 01/09/2012   Lab Results   Component Value Date   CHOL 221 (H) 09/11/2019   HDL 32.60 (L) 09/11/2019   LDLCALC 158 (H) 09/11/2019   TRIG 151.0 (H) 09/11/2019   CHOLHDL 7 09/11/2019   Assessment/Plan:   1. Vitamin D deficiency Low Vitamin D level contributes to fatigue and are associated with obesity, breast, and colon cancer. She agrees to continue to take prescription Vitamin D @50 ,000 IU every week and will follow-up for routine testing of Vitamin D, at least 2-3 times per year to avoid over-replacement.  2. Prediabetes Tamara Diaz will continue to work on weight loss, exercise, and decreasing simple carbohydrates to help decrease the risk of diabetes.  Will repeat labs in 1 month.  3. Folate deficiency Tamara Diaz is encouraged to take a prenatal vitamin.  4. Elevated LFTs Repeat CMP in 2 months.  5. Class 3 severe obesity with serious comorbidity and body mass index (BMI) of 45.0 to 49.9 in adult, unspecified obesity type (HCC) Tamara Diaz is currently in the action stage of change. As such, her goal is to continue with weight loss efforts. She has agreed to the Category 4 Plan.   Exercise goals: No exercise has been prescribed at this time.  Behavioral modification strategies: increasing lean protein intake, increasing vegetables, meal planning and cooking strategies, keeping healthy foods in the home and planning for success.  Tamara Diaz has agreed to follow-up with our clinic in 2 weeks. She was informed of the  importance of frequent follow-up visits to maximize her success with intensive lifestyle modifications for her multiple health conditions.   Objective:   Blood pressure 126/85, pulse 94, temperature 98.1 F (36.7 C), temperature source Oral, height 5\' 7"  (1.702 m), weight 293 lb (132.9 kg), last menstrual period 10/25/2019, SpO2 94 %. Body mass index is 45.89 kg/m.  General: Cooperative, alert, well developed, in no acute distress. HEENT: Conjunctivae and lids unremarkable. Cardiovascular: Regular  rhythm.  Lungs: Normal work of breathing. Neurologic: No focal deficits.   Lab Results  Component Value Date   CREATININE 0.67 09/11/2019   BUN 9 09/11/2019   NA 142 09/11/2019   K 4.3 09/11/2019   CL 105 09/11/2019   CO2 28 09/11/2019   Lab Results  Component Value Date   ALT 46 (H) 09/11/2019   AST 58 (H) 09/11/2019   ALKPHOS 55 01/09/2012   BILITOT 0.2 (L) 01/09/2012   Lab Results  Component Value Date   HGBA1C 5.6 09/11/2019   Lab Results  Component Value Date   TSH 1.57 09/11/2019   Lab Results  Component Value Date   CHOL 221 (H) 09/11/2019   HDL 32.60 (L) 09/11/2019   LDLCALC 158 (H) 09/11/2019   TRIG 151.0 (H) 09/11/2019   CHOLHDL 7 09/11/2019   Lab Results  Component Value Date   WBC 7.4 09/11/2019   HGB 14.4 09/11/2019   HCT 42.0 09/11/2019   MCV 85.5 09/11/2019   PLT 315.0 09/11/2019   Attestation Statements:   Reviewed by clinician on day of visit: allergies, medications, problem list, medical history, surgical history, family history, social history, and previous encounter notes.  Time spent on visit including pre-visit chart review and post-visit care and charting was 30 minutes.   I, Water quality scientist, CMA, am acting as transcriptionist for Coralie Common, MD.  I have reviewed the above documentation for accuracy and completeness, and I agree with the above. - Ilene Qua, MD

## 2019-10-29 NOTE — Telephone Encounter (Signed)
Please review. Thanks!  

## 2019-10-30 DIAGNOSIS — F419 Anxiety disorder, unspecified: Secondary | ICD-10-CM | POA: Diagnosis not present

## 2019-10-31 ENCOUNTER — Ambulatory Visit (INDEPENDENT_AMBULATORY_CARE_PROVIDER_SITE_OTHER): Payer: 59 | Admitting: Psychology

## 2019-10-31 DIAGNOSIS — F3289 Other specified depressive episodes: Secondary | ICD-10-CM

## 2019-11-04 NOTE — Progress Notes (Signed)
  Office: [phone]  /  Fax: [phone]    Date: November 18, 2019   Appointment Start Time: 7:59am Duration: 21 minutes Provider: Lawerance Cruel, Psy.D. Type of Session: Individual Therapy  Location of Patient: Home Location of Provider: Provider's Home Type of Contact: Telepsychological Visit via [employer]  Session Content: Tamara Diaz is a 29 y.o. female presenting via [employer] Web[employer] for a follow-up appointment to address the previously established treatment goal of increasing coping skills. Today's appointment was a telepsychological visit due to COVID-19. Tamara Diaz provided verbal consent for today's telepsychological appointment and she is aware she is responsible for securing confidentiality on her end of the session. Prior to proceeding with today's appointment, Tamara Diaz's physical location at the time of this appointment was obtained as well a phone number she could be reached at in the event of technical difficulties. Tamara Diaz and this provider participated in today's telepsychological service.   This provider conducted a brief check-in. Tamara Diaz shared about her birthday celebration, noting, "I was always able to remember my diet." She also shared about recent job interviews, adding she is moving to [place] for a new employment opportunity. Triggers for emotional eating were reviewed. She described a reduction in emotional eating. Moreover, psychoeducation regarding mindfulness was provided. A handout was provided to Tamara Diaz with further information regarding mindfulness, including exercises. This provider also explained the benefit of mindfulness as it relates to emotional eating. Tamara Diaz was encouraged to engage in the provided exercises between now and the next appointment with this provider. Tamara Diaz agreed. During today's appointment, Tamara Diaz was led through a mindfulness exercise involving her senses. Tamara Diaz provided verbal consent during today's appointment for this provider to send a handout  about mindfulness via e-mail. Tamara Diaz was receptive to today's appointment as evidenced by openness to sharing, responsiveness to feedback, and willingness to engage in mindfulness exercises to assist with coping.  Mental Status Examination:  Appearance: well groomed and appropriate hygiene  Behavior: appropriate to circumstances Mood: euthymic Affect: mood congruent Speech: normal in rate, volume, and tone Eye Contact: appropriate Psychomotor Activity: appropriate Gait: unable to assess Thought Process: linear, logical, and goal directed  Thought Content/Perception: no hallucinations, delusions, bizarre thinking or behavior reported or observed and no evidence of suicidal and homicidal ideation, plan, and intent Orientation: time, person, place and purpose of appointment Memory/Concentration: memory, attention, language, and fund of knowledge intact  Insight/Judgment: good  Interventions:  Conducted a brief chart review Provided empathic reflections and validation Reviewed content from the previous session Employed supportive psychotherapy interventions to facilitate reduced distress and to improve coping skills with identified stressors Employed motivational interviewing skills to assess patient's willingness/desire to adhere to recommended medical treatments and assignments Psychoeducation provided regarding mindfulness Engaged patient in mindfulness exercise(s) Employed acceptance and commitment interventions to emphasize mindfulness and acceptance without struggle  DSM-5 Diagnosis(es): 311 (F32.8) Other Specified Depressive Disorder, Emotional Eating Behaviors  Treatment Goal & Progress: During the initial appointment with this provider, the following treatment goal was established: increase coping skills. Tamara Diaz has demonstrated progress in her goal as evidenced by increased awareness of hunger patterns, increased awareness of triggers for emotional eating and reduction in  emotional eating. Tamara Diaz also demonstrates willingness to engage in mindfulness exercises.  Plan: The next appointment will be scheduled in 2-3 weeks, which will be via MyChart Video Visit. The next session will focus on working towards the established treatment goal.

## 2019-11-05 DIAGNOSIS — J301 Allergic rhinitis due to pollen: Secondary | ICD-10-CM | POA: Diagnosis not present

## 2019-11-05 DIAGNOSIS — J3089 Other allergic rhinitis: Secondary | ICD-10-CM | POA: Diagnosis not present

## 2019-11-07 ENCOUNTER — Telehealth: Payer: Self-pay | Admitting: Family Medicine

## 2019-11-07 NOTE — Telephone Encounter (Signed)
Patient is calling and requesting a copy of her vaccination record. Pt asked if it could be sent via my chart or she can pick. Up. CB is [phone]

## 2019-11-07 NOTE — Telephone Encounter (Signed)
Record printed off and at front desk.  Pt aware.

## 2019-11-08 DIAGNOSIS — F419 Anxiety disorder, unspecified: Secondary | ICD-10-CM | POA: Diagnosis not present

## 2019-11-12 DIAGNOSIS — J301 Allergic rhinitis due to pollen: Secondary | ICD-10-CM | POA: Diagnosis not present

## 2019-11-12 DIAGNOSIS — J3089 Other allergic rhinitis: Secondary | ICD-10-CM | POA: Diagnosis not present

## 2019-11-14 ENCOUNTER — Ambulatory Visit (INDEPENDENT_AMBULATORY_CARE_PROVIDER_SITE_OTHER): Payer: 59 | Admitting: Family Medicine

## 2019-11-18 ENCOUNTER — Other Ambulatory Visit: Payer: Self-pay

## 2019-11-18 ENCOUNTER — Ambulatory Visit (INDEPENDENT_AMBULATORY_CARE_PROVIDER_SITE_OTHER): Payer: 59 | Admitting: Psychology

## 2019-11-18 DIAGNOSIS — F3289 Other specified depressive episodes: Secondary | ICD-10-CM

## 2019-11-19 ENCOUNTER — Other Ambulatory Visit: Payer: Self-pay

## 2019-11-19 ENCOUNTER — Ambulatory Visit (INDEPENDENT_AMBULATORY_CARE_PROVIDER_SITE_OTHER): Payer: 59 | Admitting: Family Medicine

## 2019-11-19 ENCOUNTER — Encounter (INDEPENDENT_AMBULATORY_CARE_PROVIDER_SITE_OTHER): Payer: Self-pay | Admitting: Family Medicine

## 2019-11-19 VITALS — BP 118/82 | HR 92 | Temp 98.2°F | Ht 67.0 in | Wt 289.0 lb

## 2019-11-19 DIAGNOSIS — E559 Vitamin D deficiency, unspecified: Secondary | ICD-10-CM | POA: Diagnosis not present

## 2019-11-19 DIAGNOSIS — R7303 Prediabetes: Secondary | ICD-10-CM

## 2019-11-19 DIAGNOSIS — Z6841 Body Mass Index (BMI) 40.0 and over, adult: Secondary | ICD-10-CM | POA: Diagnosis not present

## 2019-11-19 NOTE — Progress Notes (Signed)
Chief Complaint:   OBESITY Tamara Diaz is here to discuss her progress with her obesity treatment plan along with follow-up of her obesity related diagnoses. Tamara Diaz is on the Category 4 Plan and states she is following her eating plan approximately 95% of the time. Tamara Diaz states she is walking the dogs for 30-45 minutes 5-6 times per week.  Today's visit was #: 3 Starting weight: 296 lbs Starting date: 10/14/2019 Today's weight: 289 lbs Today's date: 11/19/2019 Total lbs lost to date: 7 Total lbs lost since last in-office visit: 4  Interim History: Tamara Diaz is changing jobs in early May. 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 She has been applying tactics learned from Dr. Mallie Mussel to life. She denies hunger or cravings. She notes she has groceries at home.  Subjective:   1. Vitamin D deficiency Tamara Diaz denies nausea, vomiting, or muscle weakness, but she notes fatigue. She is on prescription Vit D. Last Vit D level was 10.3.  2. Pre-diabetes Tamara Diaz denies GI side effects of metformin. She is on BID dosing.  Assessment/Plan:   1. Vitamin D deficiency Low Vitamin D level contributes to fatigue and are associated with obesity, breast, and colon cancer. Carma agreed to continue taking prescription Vitamin D 50,000 IU every week, no refill needed. She will follow-up for routine testing of Vitamin D, at least 2-3 times per year to avoid over-replacement.  2. Pre-diabetes Tamara Diaz will continue to work on weight loss, exercise, and decreasing simple carbohydrates to help decrease the risk of diabetes. Tamara Diaz agreed to continue metformin, no refill needed.  3. Class 3 severe obesity with serious comorbidity and body mass index (BMI) of 45.0 to 49.9 in adult, unspecified obesity type (HCC) Tamara Diaz is currently in the action stage of change. As such, her goal is to continue with weight loss efforts. She has agreed to the Category 4 Plan.   Exercise goals: As  is.  Behavioral modification strategies: increasing lean protein intake, increasing vegetables, meal planning and cooking strategies and planning for success.  Tamara Diaz has agreed to follow-up with our clinic in 2 weeks. She was informed of the importance of frequent follow-up visits to maximize her success with intensive lifestyle modifications for her multiple health conditions.   Objective:   Blood pressure 118/82, pulse 92, temperature 98.2 F (36.8 C), temperature source Oral, height 5\' 7"  (1.702 m), weight 289 lb (131.1 kg), last menstrual period 10/25/2019, SpO2 93 %. Body mass index is 45.26 kg/m.  General: Cooperative, alert, well developed, in no acute distress. HEENT: Conjunctivae and lids unremarkable. Cardiovascular: Regular rhythm.  Lungs: Normal work of breathing. Neurologic: No focal deficits.   Lab Results  Component Value Date   CREATININE 0.67 09/11/2019   BUN 9 09/11/2019   NA 142 09/11/2019   K 4.3 09/11/2019   CL 105 09/11/2019   CO2 28 09/11/2019   Lab Results  Component Value Date   ALT 46 (H) 09/11/2019   AST 58 (H) 09/11/2019   ALKPHOS 55 01/09/2012   BILITOT 0.2 (L) 01/09/2012   Lab Results  Component Value Date   HGBA1C 5.6 09/11/2019   No results found for: INSULIN Lab Results  Component Value Date   TSH 1.57 09/11/2019   Lab Results  Component Value Date   CHOL 221 (H) 09/11/2019   HDL 32.60 (L) 09/11/2019   LDLCALC 158 (H) 09/11/2019   TRIG 151.0 (H) 09/11/2019   CHOLHDL 7 09/11/2019   Lab Results  Component Value Date   WBC 7.4 09/11/2019   HGB 14.4 09/11/2019   HCT 42.0 09/11/2019   MCV 85.5 09/11/2019   PLT 315.0 09/11/2019   No results found for: IRON, TIBC, FERRITIN  Attestation Statements:   Reviewed by clinician on day of visit: allergies, medications, problem list, medical history, surgical history, family history, social history, and previous encounter notes.  Time spent on visit including pre-visit chart review  and post-visit care and charting was 15 minutes.    I, Burt Knack, am acting as transcriptionist for Margarette Asal, MD.  I have reviewed the above documentation for accuracy and completeness, and I agree with the above. - Debbra Riding, MD

## 2019-11-21 ENCOUNTER — Telehealth: Payer: 59 | Admitting: Emergency Medicine

## 2019-11-21 DIAGNOSIS — J3089 Other allergic rhinitis: Secondary | ICD-10-CM | POA: Diagnosis not present

## 2019-11-21 DIAGNOSIS — J309 Allergic rhinitis, unspecified: Secondary | ICD-10-CM

## 2019-11-21 DIAGNOSIS — J069 Acute upper respiratory infection, unspecified: Secondary | ICD-10-CM | POA: Diagnosis not present

## 2019-11-21 DIAGNOSIS — J301 Allergic rhinitis due to pollen: Secondary | ICD-10-CM | POA: Diagnosis not present

## 2019-11-21 MED ORDER — SALINE SPRAY 0.65 % NA SOLN: 1.0000 | NASAL | 0 refills | Status: AC | PRN

## 2019-11-21 MED ORDER — LEVOCETIRIZINE DIHYDROCHLORIDE 5 MG PO TABS: 5.0000 mg | ORAL_TABLET | Freq: Every evening | ORAL | 0 refills | Status: AC

## 2019-11-21 NOTE — Progress Notes (Signed)
E visit for Allergic Rhinitis We are sorry that you are not feeling well.  Here is how we plan to help!  Based on what you have shared with me it looks like you have Allergic Rhinitis.  Rhinitis is when a reaction occurs that causes nasal congestion, runny nose, sneezing, and itching.  Most types of rhinitis are caused by an inflammation and are associated with symptoms in the eyes ears or throat. There are several types of rhinitis.  The most common are acute rhinitis, which is usually caused by a viral illness, allergic or seasonal rhinitis, and nonallergic or year-round rhinitis.  Nasal allergies occur certain times of the year.  Allergic rhinitis is caused when allergens in the air trigger the release of histamine in the body.  Histamine causes itching, swelling, and fluid to build up in the fragile linings of the nasal passages, sinuses and eyelids.  An itchy nose and clear discharge are common.  I recommend the following over the counter treatments: Xyzal 5 mg take 1 tablet daily, I would recommend stopping other allergy meds (claritin, zyrtec) and trying Xyzal for 2 weeks.  Sometimes the body needs a change to avoid "getting used to" a medicine.  I also would recommend a nasal spray: Flonase 2 sprays into each nostril once daily and Nasal saline to irrigate the sinus and nasal passageway.  Sometimes this is the most useful (although least pleasant) because it mechanically removes the debris in the nose and sinuses.  You may also benefit from eye drops such as: Systane 1-2 driops each eye twice daily as needed  HOME CARE:   You can use an over-the-counter saline nasal spray as needed  Avoid areas where there is heavy dust, mites, or molds  Stay indoors on windy days during the pollen season  Keep windows closed in home, at least in bedroom; use air conditioner.  Use high-efficiency house air filter  Keep windows closed in car, turn AC on re-circulate  Avoid playing out with dog  during pollen season  GET HELP RIGHT AWAY IF:   If your symptoms do not improve within 10 days  You become short of breath  You develop yellow or green discharge from your nose for over 3 days  You have coughing fits  MAKE SURE YOU:   Understand these instructions  Will watch your condition  Will get help right away if you are not doing well or get worse  Thank you for choosing an e-visit. Your e-visit answers were reviewed by a board certified advanced clinical practitioner to complete your personal care plan. Depending upon the condition, your plan could have included both over the counter or prescription medications. Please review your pharmacy choice. Be sure that the pharmacy you have chosen is open so that you can pick up your prescription now.  If there is a problem you may message your provider in MyChart to have the prescription routed to another pharmacy. Your safety is important to [name]. If you have drug allergies check your prescription carefully.  For the next 24 hours, you can use MyChart to ask questions about today's visit, request a non-urgent call back, or ask for a work or school excuse from your e-visit provider. You will get an email in the next two days asking about your experience. I hope that your e-visit has been valuable and will speed your recovery.   Approximately 5 minutes was used in reviewing the patient's chart, questionnaire, prescribing medications, and documentation.

## 2019-11-22 ENCOUNTER — Encounter: Payer: Self-pay | Admitting: Internal Medicine

## 2019-11-22 ENCOUNTER — Other Ambulatory Visit: Payer: Self-pay

## 2019-11-22 ENCOUNTER — Ambulatory Visit: Payer: 59 | Admitting: Internal Medicine

## 2019-11-22 VITALS — BP 118/70 | HR 84 | Ht 67.0 in | Wt 295.0 lb

## 2019-11-22 DIAGNOSIS — R7989 Other specified abnormal findings of blood chemistry: Secondary | ICD-10-CM

## 2019-11-22 LAB — TSH: TSH: 2.06 u[IU]/mL (ref 0.35–4.50)

## 2019-11-22 LAB — T3, FREE: T3, Free: 3.9 pg/mL (ref 2.3–4.2)

## 2019-11-22 LAB — T4, FREE: Free T4: 0.72 ng/dL (ref 0.60–1.60)

## 2019-11-22 MED FILL — LEVOCETIRIZINE 5 MG TABLET: 5 | 30 days supply | Qty: 30 | Fill #0

## 2019-11-22 NOTE — Patient Instructions (Signed)
Please stop at the lab.  We will schedule a new appt as needed.

## 2019-11-22 NOTE — Progress Notes (Signed)
Patient ID: Tamara Diaz, female   DOB: 1991/03/29, 29 y.o.   MRN: [id number]   This visit occurred during the SARS-CoV-2 public health emergency.  Safety protocols were in place, including screening questions prior to the visit, additional usage of staff PPE, and extensive cleaning of exam room while observing appropriate contact time as indicated for disinfecting solutions.   HPI  Tamara Diaz is a 29 y.o.-year-old female, referred by Dr. Rinaldo Ratel, for management of an elevated total T3.  Her sister in law, and Tamara Diaz, is also my pt.  Patient is seen in the weight management clinic by Dr. Rinaldo Ratel - started in 10/2019.  During general investigation, she was found to have a normal TSH, normal free T4 but elevated total T3.  She was referred to endocrinology for further investigation.  I reviewed pt's thyroid tests: Component     Latest Ref Rng & Units 10/14/2019  Triiodothyronine (T3)     71 - 180 ng/dL 619 (H)   Lab Results  Component Value Date   TSH 1.57 09/11/2019   TSH 4.090 09/27/2018   FREET4 1.17 10/14/2019   FREET4 1.05 09/27/2018   Antithyroid antibodies: No results found for: THGAB No components found for: TPOAB  Pt mentions: - + Intentional weight loss, increased appetite - + Fatigue - + Both cold and heat intolerance - no  tremors - no palpitations - + Chronic anxiety - dx'ed at 29 y/o - no hyperdefecation - + hair loss  Pt denies feeling nodules in neck, hoarseness, dysphagia/odynophagia, SOB with lying down.  She has + FH of thyroid disorders in: MGM, M - thyroid nodule - removed. No FH of thyroid cancer.  No h/o radiation tx to head or neck. No recent use of iodine supplements. She was on Biotin >> stopped 09/2019. On Prenatal vitamins.  Of note, patient is on oral contraceptives.  Pt. also has a history of a slightly high total high testosterone level at recent check: Component     Latest Ref Rng & Units 09/11/2019  Testosterone     15.00 - 40.00  ng/dL 50.93 (H)   She also has severe vitamin D deficiency: Lab Results  Component Value Date   VD25OH 10.31 (L) 09/11/2019  She was started on high-dose vitamin D supplementation.  ROS: Constitutional: + See HPI Eyes: no blurry vision, no xerophthalmia ENT: no sore throat, + see HPI Cardiovascular: no CP/SOB/palpitations/leg swelling Respiratory: + Cough/no SOB Gastrointestinal: no N/V/D/C/no acid reflux Musculoskeletal: no muscle/joint aches Skin: + Easy bruising, + stretch marks, + itching, + hair loss, + excessive hair growth Neurological: no tremors/numbness/tingling/dizziness, + headaches Psychiatric: + Both depression/anxiety  Past Medical History:  Diagnosis Date  . Anxiety   . Back pain   . Depression   . Environmental allergies   . Fatigue   . Lactose intolerance   . Migraines   . PCOS (polycystic ovarian syndrome)   . Pre-diabetes   . Prediabetes   . Vitamin D deficiency    Past Surgical History:  Procedure Laterality Date  . APPENDECTOMY    . CYSTECTOMY    . TONSILLECTOMY AND ADENOIDECTOMY     Social History   Socioeconomic History  . Marital status: Single    Spouse name: Not on file  . Number of children: 0  . Years of education: Not on file  . Highest education level: Not on file  Occupational History  . Occupation: Engineer, production: [employer]  Tobacco Use  .  Smoking status: Never Smoker  . Smokeless tobacco: Never Used  Substance and Sexual Activity  . Alcohol use: Yes    Comment: socially  . Drug use: Never  . Sexual activity: Not Currently    Comment: 1st intercourse 29 yo-More than 5 partners  Other Topics Concern  . Not on file  Social History Narrative  . Not on file   Social Determinants of Health   Financial Resource Strain:   . Difficulty of Paying Living Expenses:   Food Insecurity:   . Worried About Programme researcher, broadcasting/film/video in the Last Year:   . Barista in the Last Year:   Transportation Needs:    . Freight forwarder (Medical):   Marland Kitchen Lack of Transportation (Non-Medical):   Physical Activity:   . Days of Exercise per Week:   . Minutes of Exercise per Session:   Stress:   . Feeling of Stress :   Social Connections:   . Frequency of Communication with Friends and Family:   . Frequency of Social Gatherings with Friends and Family:   . Attends Religious Services:   . Active Member of Clubs or Organizations:   . Attends Banker Meetings:   Marland Kitchen Marital Status:   Intimate Partner Violence:   . Fear of Current or Ex-Partner:   . Emotionally Abused:   Marland Kitchen Physically Abused:   . Sexually Abused:    Current Outpatient Medications on File Prior to Visit  Medication Sig Dispense Refill  . albuterol (VENTOLIN HFA) 108 (90 Base) MCG/ACT inhaler Inhale 2 puffs into the lungs every 4 (four) hours as needed for shortness of breath. 1 Inhaler 0  . cetirizine (ZYRTEC) 10 MG tablet Take 10 mg by mouth daily.    . fluticasone (FLONASE) 50 MCG/ACT nasal spray Place 2 sprays into both nostrils daily. 16 g 6  . KAITLIB FE 0.8-25 MG-MCG tablet     . levocetirizine (XYZAL) 5 MG tablet Take 1 tablet (5 mg total) by mouth every evening. 30 tablet 0  . Melatonin 5 MG TABS Take 1 tablet by mouth at bedtime.    . metFORMIN (GLUCOPHAGE XR) 500 MG 24 hr tablet 2 tabs po qPM x 2 weeks then 1 tab po qAM and 2 tabs po qPM 270 tablet 1  . montelukast (SINGULAIR) 10 MG tablet Take 10 mg by mouth at bedtime. 25 mg per pt before bed    . sodium chloride (OCEAN) 0.65 % SOLN nasal spray Place 1 spray into both nostrils as needed for congestion. 60 mL 0  . spironolactone (ALDACTONE) 100 MG tablet Take 1 tablet (100 mg total) by mouth daily. 90 tablet 1  . traZODone (DESYREL) 50 MG tablet Take 50 mg by mouth at bedtime.    . Vitamin D, Ergocalciferol, (DRISDOL) 1.25 MG (50000 UNIT) CAPS capsule Take 1 capsule (50,000 Units total) by mouth every 7 (seven) days. 5 capsule 3   No current facility-administered  medications on file prior to visit.   Allergies  Allergen Reactions  . Codeine     insomnia   . Lactose Intolerance (Gi) Diarrhea  . Zinc Oxide Rash   Family History  Problem Relation Age of Onset  . Diabetes Father   . Hypertension Father   . Heart disease Father   . Depression Father   . Anxiety disorder Father   . Bipolar disorder Father   . Obesity Father     PE: BP 118/70   Pulse 84  Ht 5\' 7"  (1.702 m)   Wt 295 lb (133.8 kg)   LMP 10/25/2019   SpO2 96%   BMI 46.20 kg/m  Wt Readings from Last 3 Encounters:  11/22/19 295 lb (133.8 kg)  11/19/19 289 lb (131.1 kg)  10/28/19 293 lb (132.9 kg)   Constitutional: Obese, in NAD Eyes: PERRLA, EOMI, no exophthalmos ENT: moist mucous membranes, no thyromegaly, no cervical lymphadenopathy Cardiovascular: RRR, No MRG Respiratory: CTA B Gastrointestinal: abdomen soft, NT, ND, BS+ Musculoskeletal: no deformities, strength intact in all 4 Skin: moist, warm, no rashes Neurological: no tremor with outstretched hands, DTR normal in all 4  ASSESSMENT: 1.  Abnormal thyroid tests: Elevated total T3  2.  Elevated total testosterone  PLAN:  1. Patient seen in the weight management clinic and found to have an elevated total T3 associated with a normal TSH and a normal free T4.  We discussed that the total T3 may be elevated in the setting of oral contraceptive use due to stimulation of TBG (thyroid binding globulin) and increase in the total, but not the free (active) thyroid hormone.  That is why free thyroid hormone measurement is a more specific test to check for thyrotoxicosis.  The same phenomena can happen to account for an elevated total testosterone (due to OCPs stimulating SHBG and increasing the total testosterone), while the free testosterone level may be normal. - she appears euthyroid.  She was able to lose weight and has lost approximately 7 pounds in the last month.  At today's visit, weight is higher, but this is  checked on another scale. - she does not appear to have a goiter, thyroid nodules, or neck compression symptoms - At this visit, we will check again her TSH, free T4, and free T3 - we did discuss that if the new tests in the age of thyrotoxicosis, possible etiologies are: Graves' disease, thyroiditis, toxic adenoma.  We discussed about management options for the above conditions. - If labs today are abnormal, she will need to return in ~6 weeks for repeat labs - If they are normal, no further investigation is needed and I will see her on a prn basis in the future  2.  Elevated total testosterone -She does have a history of PCOS.  She had several questions about this syndrome.  I explained the nature of the abnormality: LH/FSH pulse generator dysfunction. I explained that a patient does not necessarily have to have polycystic ovaries to be diagnosed with the disorder. This is of sum of several conditions, including:  weight gain  insulin resistance (and therefore a higher risk of developing diabetes later in life)  acne  hirsutism  irregular menstrual cycles  decreased fertility. - We also discussed about the fact that the treatment is usually targeted to addressing the problem that concerns the patient the most: acne/hirsutism, weight gain, or fertility, but there is no single treatment for PCOS.  - The first-line therapy are oral contraceptives. If a pt is concerned with her weight, we can use metformin; if she is concerned about acne/hirsutism, we can add spironolactone; and if she is concerned about fertility, I could refer her to reproductive endocrinology for possible use of clomiphene.  She does not have any immediate desire for pregnancy. She is currently  continues on Metformin, spironolactone, and OCPs.  I explained that weight loss is less stable of PCOS care and she is doing a good job working on this. -Reviewed latest testosterone level: This has been slightly high, possibly due  to  increased SHBG by OCPs. However, also, this was checked with patient on spironolactone, which would block the testosterone receptors and rendered the total testosterone levels nondiagnostic  Office Visit on 11/22/2019  Component Date Value Ref Range Status  . TSH 11/22/2019 2.06  0.35 - 4.50 uIU/mL Final  . Free T4 11/22/2019 0.72  0.60 - 1.60 ng/dL Final   Comment: Specimens from patients who are undergoing biotin therapy and /or ingesting biotin supplements may contain high levels of biotin.  The higher biotin concentration in these specimens interferes with this Free T4 assay.  Specimens that contain high levels  of biotin may cause false high results for this Free T4 assay.  Please interpret results in light of the total clinical presentation of the patient.    . T3, Free 11/22/2019 3.9  2.3 - 4.2 pg/mL Final   All thyroid tests are normal.  No further investigation/intervention is needed for this.  Philemon Kingdom, MD PhD [facility]

## 2019-11-25 NOTE — Progress Notes (Unsigned)
Office: [phone]  /  Fax: [phone]    Date: December 05, 2019   Appointment Start Time: *** Duration: *** minutes Provider: Lawerance Cruel, Psy.D. Type of Session: Individual Therapy  Location of Patient: {gbptloc:23249} Location of Provider: Provider's Home Type of Contact: Telepsychological Visit via MyChart Video Visit  Session Content: Tamara Diaz is a 29 y.o. female presenting via MyChart Video Visit for a follow-up appointment to address the previously established treatment goal of increasing coping skills. Today's appointment was a telepsychological visit due to COVID-19. Tamara Diaz provided verbal consent for today's telepsychological appointment and she is aware she is responsible for securing confidentiality on her end of the session. Prior to proceeding with today's appointment, Tamara Diaz's physical location at the time of this appointment was obtained as well a phone number she could be reached at in the event of technical difficulties. Tamara Diaz and this provider participated in today's telepsychological service.   This provider conducted a brief check-in and verbally administered the PHQ-9 and GAD-7. *** Tamara Diaz was receptive to today's appointment as evidenced by openness to sharing, responsiveness to feedback, and {gbreceptiveness:23401}.  Mental Status Examination:  Appearance: {Appearance:22431} Behavior: {Behavior:22445} Mood: {gbmood:21757} Affect: {Affect:22436} Speech: {Speech:22432} Eye Contact: {Eye Contact:22433} Psychomotor Activity: {Motor Activity:22434} Gait: {gbgait:23404} Thought Process: {thought process:22448}  Thought Content/Perception: {disturbances:22451} Orientation: {Orientation:22437} Memory/Concentration: {gbcognition:22449} Insight/Judgment: {Insight:22446}  Structured Assessments Results: The Patient Health Questionnaire-9 (PHQ-9) is a self-report measure that assesses symptoms and severity of depression over the course of the last two weeks.  Tamara Diaz obtained a score of *** suggesting {GBPHQ9SEVERITY:21752}. Tamara Diaz finds the endorsed symptoms to be {gbphq9difficulty:21754}. [0= Not at all; 1= Several days; 2= More than half the days; 3= Nearly every day] Little interest or pleasure in doing things ***  Feeling down, depressed, or hopeless ***  Trouble falling or staying asleep, or sleeping too much ***  Feeling tired or having little energy ***  Poor appetite or overeating ***  Feeling bad about yourself --- or that you are a failure or have let yourself or your family down ***  Trouble concentrating on things, such as reading the newspaper or watching television ***  Moving or speaking so slowly that other people could have noticed? Or the opposite --- being so fidgety or restless that you have been moving around a lot more than usual ***  Thoughts that you would be better off dead or hurting yourself in some way ***  PHQ-9 Score ***    The Generalized Anxiety Disorder-7 (GAD-7) is a brief self-report measure that assesses symptoms of anxiety over the course of the last two weeks. Tamara Diaz obtained a score of *** suggesting {gbgad7severity:21753}. Tamara Diaz finds the endorsed symptoms to be {gbphq9difficulty:21754}. [0= Not at all; 1= Several days; 2= Over half the days; 3= Nearly every day] Feeling nervous, anxious, on edge ***  Not being able to stop or control worrying ***  Worrying too much about different things ***  Trouble relaxing ***  Being so restless that it's hard to sit still ***  Becoming easily annoyed or irritable ***  Feeling afraid as if something awful might happen ***  GAD-7 Score ***   Interventions:  {Interventions for Progress Notes:23405}  DSM-5 Diagnosis(es): 311 (F32.8) Other Specified Depressive Disorder, Emotional Eating Behaviors  Treatment Goal & Progress: During the initial appointment with this provider, the following treatment goal was established: increase coping skills. Tamara Diaz has  demonstrated progress in her goal as evidenced by {gbtxprogress:22839}. Tamara Diaz also {gbtxprogress2:22951}.  Plan: The next appointment will be scheduled in {  gbweeks:21758}, which will be {gbtxmodality:23402}. The next session will focus on {Plan for Next Appointment:23400}.

## 2019-11-28 ENCOUNTER — Other Ambulatory Visit: Payer: Self-pay

## 2019-11-28 DIAGNOSIS — J3089 Other allergic rhinitis: Secondary | ICD-10-CM | POA: Diagnosis not present

## 2019-11-28 DIAGNOSIS — J301 Allergic rhinitis due to pollen: Secondary | ICD-10-CM | POA: Diagnosis not present

## 2019-11-28 MED FILL — VIT D2 1.25 MG (50,000 UNIT: 1.25 MG | 28 days supply | Qty: 4 | Fill #2

## 2019-11-28 MED FILL — SPIRONOLACTONE 100 MG TAB: 100 | 90 days supply | Qty: 90 | Fill #1

## 2019-11-28 MED FILL — metFORMIN HCL ER 500 MG TB2: 500 | 90 days supply | Qty: 270 | Fill #1

## 2019-11-29 ENCOUNTER — Encounter: Payer: Self-pay | Admitting: Physician Assistant

## 2019-11-29 ENCOUNTER — Telehealth: Payer: 59 | Admitting: Physician Assistant

## 2019-11-29 DIAGNOSIS — J019 Acute sinusitis, unspecified: Secondary | ICD-10-CM | POA: Diagnosis not present

## 2019-11-29 MED ORDER — AMOXICILLIN-POT CLAVULANATE 875-125 MG PO TABS: 1.0000 | ORAL_TABLET | Freq: Two times a day (BID) | ORAL | 0 refills | Status: DC

## 2019-11-29 NOTE — Progress Notes (Signed)
We are sorry that you are not feeling well.  Here is how we plan to help!  Based on what you have shared with me it looks like you have sinusitis.  Sinusitis is inflammation and infection in the sinus cavities of the head.  Based on your presentation I believe you most likely have Acute Bacterial Sinusitis.  This is an infection caused by bacteria and is treated with antibiotics. I have prescribed Augmentin 875mg/125mg one tablet twice daily with food, for 7 days. You may use an oral decongestant such as Mucinex D or if you have glaucoma or high blood pressure use plain Mucinex. Saline nasal spray help and can safely be used as often as needed for congestion.  If you develop worsening sinus pain, fever or notice severe headache and vision changes, or if symptoms are not better after completion of antibiotic, please schedule an appointment with a health care provider.    Sinus infections are not as easily transmitted as other respiratory infection, however we still recommend that you avoid close contact with loved ones, especially the very young and elderly.  Remember to wash your hands thoroughly throughout the day as this is the number one way to prevent the spread of infection!  Home Care:  Only take medications as instructed by your medical team.  Complete the entire course of an antibiotic.  Do not take these medications with alcohol.  A steam or ultrasonic humidifier can help congestion.  You can place a towel over your head and breathe in the steam from hot water coming from a faucet.  Avoid close contacts especially the very young and the elderly.  Cover your mouth when you cough or sneeze.  Always remember to wash your hands.  Get Help Right Away If:  You develop worsening fever or sinus pain.  You develop a severe head ache or visual changes.  Your symptoms persist after you have completed your treatment plan.  Make sure you  Understand these instructions.  Will watch your  condition.  Will get help right away if you are not doing well or get worse.  Your e-visit answers were reviewed by a board certified advanced clinical practitioner to complete your personal care plan.  Depending on the condition, your plan could have included both over the counter or prescription medications.  If there is a problem please reply  once you have received a response from your provider.  Your safety is important to us.  If you have drug allergies check your prescription carefully.    You can use MyChart to ask questions about today's visit, request a non-urgent call back, or ask for a work or school excuse for 24 hours related to this e-Visit. If it has been greater than 24 hours you will need to follow up with your provider, or enter a new e-Visit to address those concerns.  You will get an e-mail in the next two days asking about your experience.  I hope that your e-visit has been valuable and will speed your recovery. Thank you for using e-visits.   I spent 5-10 minutes on review and completion of this note- Sahar Osman PAC  

## 2019-12-02 ENCOUNTER — Encounter: Payer: 59 | Admitting: Women's Health

## 2019-12-05 ENCOUNTER — Telehealth (INDEPENDENT_AMBULATORY_CARE_PROVIDER_SITE_OTHER): Payer: 59 | Admitting: Psychology

## 2019-12-05 ENCOUNTER — Ambulatory Visit (INDEPENDENT_AMBULATORY_CARE_PROVIDER_SITE_OTHER): Payer: 59 | Admitting: Family Medicine

## 2019-12-10 ENCOUNTER — Encounter: Payer: 59 | Admitting: Nurse Practitioner

## 2019-12-10 NOTE — Telephone Encounter (Signed)
Appointment cancelled per patient request

## 2019-12-12 DIAGNOSIS — J3089 Other allergic rhinitis: Secondary | ICD-10-CM | POA: Diagnosis not present

## 2019-12-12 DIAGNOSIS — J301 Allergic rhinitis due to pollen: Secondary | ICD-10-CM | POA: Diagnosis not present

## 2019-12-16 ENCOUNTER — Encounter: Payer: Self-pay | Admitting: Family Medicine

## 2019-12-17 ENCOUNTER — Telehealth: Payer: 59 | Admitting: Physician Assistant

## 2019-12-17 DIAGNOSIS — R059 Cough, unspecified: Secondary | ICD-10-CM

## 2019-12-17 DIAGNOSIS — R05 Cough: Secondary | ICD-10-CM

## 2019-12-17 MED ORDER — BENZONATATE 100 MG PO CAPS: 100.0000 mg | ORAL_CAPSULE | Freq: Two times a day (BID) | ORAL | 0 refills | Status: DC | PRN

## 2019-12-17 NOTE — Progress Notes (Signed)
We are sorry that you are not feeling well.  Here is how we plan to help!  Based on your presentation I believe you most likely have A cough due to allergies or possible a viral URI.   An over the counter allergy medication may be helpful   In addition you may use A prescription cough medication called Tessalon Perles 100mg . You may take 1-2 capsules every 8 hours as needed for your cough.    From your responses in the eVisit questionnaire you describe inflammation in the upper respiratory tract which is causing a significant cough.  This is commonly called Bronchitis and has four common causes:    Allergies  Viral Infections  Acid Reflux  Bacterial Infection Allergies, viruses and acid reflux are treated by controlling symptoms or eliminating the cause. An example might be a cough caused by taking certain blood pressure medications. You stop the cough by changing the medication. Another example might be a cough caused by acid reflux. Controlling the reflux helps control the cough.  USE OF BRONCHODILATOR ("RESCUE") INHALERS: There is a risk from using your bronchodilator too frequently.  The risk is that over-reliance on a medication which only relaxes the muscles surrounding the breathing tubes can reduce the effectiveness of medications prescribed to reduce swelling and congestion of the tubes themselves.  Although you feel brief relief from the bronchodilator inhaler, your asthma may actually be worsening with the tubes becoming more swollen and filled with mucus.  This can delay other crucial treatments, such as oral steroid medications. If you need to use a bronchodilator inhaler daily, several times per day, you should discuss this with your provider.  There are probably better treatments that could be used to keep your asthma under control.     HOME CARE . Only take medications as instructed by your medical team. . Complete the entire course of an antibiotic. . Drink plenty of fluids  and get plenty of rest. . Avoid close contacts especially the very young and the elderly . Cover your mouth if you cough or cough into your sleeve. . Always remember to wash your hands . A steam or ultrasonic humidifier can help congestion.   GET HELP RIGHT AWAY IF: . You develop worsening fever. . You become short of breath . You cough up blood. . Your symptoms persist after you have completed your treatment plan MAKE SURE YOU   Understand these instructions.  Will watch your condition.  Will get help right away if you are not doing well or get worse.  Your e-visit answers were reviewed by a board certified advanced clinical practitioner to complete your personal care plan.  Depending on the condition, your plan could have included both over the counter or prescription medications. If there is a problem please reply  once you have received a response from your provider. Your safety is important to .  If you have drug allergies check your prescription carefully.    You can use MyChart to ask questions about today's visit, request a non-urgent call back, or ask for a work or school excuse for 24 hours related to this e-Visit. If it has been greater than 24 hours you will need to follow up with your provider, or enter a new e-Visit to address those concerns. You will get an e-mail in the next two days asking about your experience.  I hope that your e-visit has been valuable and will speed your recovery. Thank you for using e-visits.  5  minutes on chart

## 2019-12-18 ENCOUNTER — Telehealth: Payer: 59 | Admitting: Nurse Practitioner

## 2019-12-18 DIAGNOSIS — J301 Allergic rhinitis due to pollen: Secondary | ICD-10-CM

## 2019-12-18 MED ORDER — TRAZODONE HCL 50 MG PO TABS: 50.0000 mg | ORAL_TABLET | Freq: Every day | ORAL | 1 refills | Status: AC

## 2019-12-18 MED ORDER — MONTELUKAST SODIUM 10 MG PO TABS: 10.0000 mg | ORAL_TABLET | Freq: Every day | ORAL | 3 refills | Status: AC

## 2019-12-18 MED ORDER — FLUTICASONE PROPIONATE 50 MCG/ACT NA SUSP
2.0000 | Freq: Every day | NASAL | 6 refills | Status: AC
Start: 2019-12-18 — End: ?

## 2019-12-18 NOTE — Progress Notes (Signed)
E visit for Allergic Rhinitis We are sorry that you are not feeling well.  Here is how we plan to help!  Based on what you have shared with me it looks like you have Allergic Rhinitis.  Rhinitis is when a reaction occurs that causes nasal congestion, runny nose, sneezing, and itching.  Most types of rhinitis are caused by an inflammation and are associated with symptoms in the eyes ears or throat. There are several types of rhinitis.  The most common are acute rhinitis, which is usually caused by a viral illness, allergic or seasonal rhinitis, and nonallergic or year-round rhinitis.  Nasal allergies occur certain times of the year.  Allergic rhinitis is caused when allergens in the air trigger the release of histamine in the body.  Histamine causes itching, swelling, and fluid to build up in the fragile linings of the nasal passages, sinuses and eyelids.  An itchy nose and clear discharge are common.  I recommend the following over the counter treatments: Clarinex 5 mg take 1 tablet daily (NOTE: Do not take if pregnant or breastfeeding)  I also would recommend a nasal spray: prescription sent to pharmacy Flonase 2 sprays into each nostril once daily  You may also benefit from eye drops such as: Systane 1-2 driops each eye twice daily as needed  HOME CARE:   You can use an over-the-counter saline nasal spray as needed  Avoid areas where there is heavy dust, mites, or molds  Stay indoors on windy days during the pollen season  Keep windows closed in home, at least in bedroom; use air conditioner.  Use high-efficiency house air filter  Keep windows closed in car, turn AC on re-circulate  Avoid playing out with dog during pollen season  GET HELP RIGHT AWAY IF:   If your symptoms do not improve within 10 days  You become short of breath  You develop yellow or green discharge from your nose for over 3 days  You have coughing fits  MAKE SURE YOU:   Understand these  instructions  Will watch your condition  Will get help right away if you are not doing well or get worse  Thank you for choosing an e-visit. Your e-visit answers were reviewed by a board certified advanced clinical practitioner to complete your personal care plan. Depending upon the condition, your plan could have included both over the counter or prescription medications. Please review your pharmacy choice. Be sure that the pharmacy you have chosen is open so that you can pick up your prescription now.  If there is a problem you may message your provider in MyChart to have the prescription routed to another pharmacy. Your safety is important to [name]. If you have drug allergies check your prescription carefully.  For the next 24 hours, you can use MyChart to ask questions about today's visit, request a non-urgent call back, or ask for a work or school excuse from your e-visit provider. You will get an email in the next two days asking about your experience. I hope that your e-visit has been valuable and will speed your recovery.    5-10 minutes spent reviewing and documenting in chart.

## 2019-12-21 ENCOUNTER — Telehealth: Payer: 59 | Admitting: Family

## 2019-12-21 DIAGNOSIS — J329 Chronic sinusitis, unspecified: Secondary | ICD-10-CM

## 2019-12-21 NOTE — Progress Notes (Signed)
Based on what you shared with me, I feel your condition warrants further evaluation and I recommend that you be seen for a face to face visit.  Please contact your primary care physician practice to be seen. Many offices offer virtual options to be seen via video if you are not comfortable going in person to a medical facility at this time.  You have had several e-visits recently. Sometimes it is best to actually better to see you in person  If you do not have a PCP, [facility] offers a free physician referral service available at [phone]. Our trained staff has the experience, knowledge and resources to put you in touch with a physician who is right for you.   You also have the option of a video visit through [email or web address]  If you are having a true medical emergency please call 911.  NOTE: If you entered your credit card information for this eVisit, you will not be charged. You may see a "hold" on your card for the $35 but that hold will drop off and you will not have a charge processed.  Your e-visit answers were reviewed by a board certified advanced clinical practitioner to complete your personal care plan.  Thank you for using e-Visits. Greater than 5 minutes, yet less than 10 minutes of time have been spent researching, coordinating, and implementing care for this patient today.  Thank you for the details you included in the comment boxes. Those details are very helpful in determining the best course of treatment for you and help [name] to provide the best care.

## 2019-12-30 MED FILL — VIT D2 1.25 MG (50,000 UNIT: 1.25 MG | 28 days supply | Qty: 4 | Fill #3

## 2019-12-30 MED FILL — MONTELUKAST SOD 10 MG TAB: 10 | 90 days supply | Qty: 90 | Fill #0

## 2020-01-02 DIAGNOSIS — J3089 Other allergic rhinitis: Secondary | ICD-10-CM | POA: Diagnosis not present

## 2020-01-02 DIAGNOSIS — J301 Allergic rhinitis due to pollen: Secondary | ICD-10-CM | POA: Diagnosis not present

## 2020-01-03 ENCOUNTER — Encounter: Payer: Self-pay | Admitting: Nurse Practitioner

## 2020-01-03 ENCOUNTER — Telehealth: Payer: 59 | Admitting: Nurse Practitioner

## 2020-01-03 DIAGNOSIS — H00015 Hordeolum externum left lower eyelid: Secondary | ICD-10-CM

## 2020-01-03 MED ORDER — NEOMYCIN-POLYMYXIN-HC OP SUSP: OPHTHALMIC | 0 refills | Status: DC

## 2020-01-03 MED ORDER — ERYTHROMYCIN 5 MG/GM OP OINT: TOPICAL_OINTMENT | OPHTHALMIC | 0 refills | Status: DC

## 2020-01-03 NOTE — Progress Notes (Signed)
Neomycin-polymyxin eye drops not available. Will send new prescription for erythromycin ointment.

## 2020-01-03 NOTE — Progress Notes (Signed)
We are sorry that you are not feeling well. Here is how we plan to help!  Based on what you have shared with me it looks like you have a stye.  A stye is an inflammation of the eyelid.  It is often a red, painful lump near the edge of the eyelid that may look like a boil or a pimple.  A stye develops when an infection occurs at the base of an eyelash.   We have made appropriate suggestions for you based upon your presentation: Simple styes can be treated without medical intervention.  Most styes either resolve spontaneously or resolve with simple home treatment by applying warm compresses or heated washcloth to the stye for about 10-15 minutes three to four times a day. This causes the stye to drain and resolve.  HOME CARE:   Wash your hands often!  Let the stye open on its own. Don't squeeze or open it.  Don't rub your eyes. This can irritate your eyes and let in bacteria.  If you need to touch your eyes, wash your hands first.  Don't wear eye makeup or contact lenses until the area has healed.  GET HELP RIGHT AWAY IF:   Your symptoms do not improve.  You develop blurred or loss of vision.  Your symptoms worsen (increased discharge, pain or redness).  Thank you for choosing an e-visit.  Your e-visit answers were reviewed by a board certified advanced clinical practitioner to complete your personal care plan.  Depending upon the condition, your plan could have included both over the counter or prescription medications.  Please review your pharmacy choice.  Make sure the pharmacy is open so you can pick up prescription now.  If there is a problem, you may contact your provider through [employer] and have the prescription routed to another pharmacy.    Your safety is important to [name].  If you have drug allergies check your prescription carefully.  For the next 24 hours you can use MyChart to ask questions about today's visit, request a non-urgent call back, or ask for a work or  school excuse.  You will get an email in the next two days asking about your experience.  I hope you that your e-visit has been valuable and will speed your recovery.  I have spent at least 5 minutes reviewing and documenting in the patient's chart.

## 2020-01-03 NOTE — Addendum Note (Signed)
Addended by: Jackquline Berlin on: 01/03/2020 10:38 AM   Modules accepted: Orders

## 2020-01-03 NOTE — Addendum Note (Signed)
Addended by: Jackquline Berlin on: 01/03/2020 09:55 AM   Modules accepted: Orders

## 2020-01-06 ENCOUNTER — Telehealth: Payer: 59 | Admitting: Physician Assistant

## 2020-01-06 DIAGNOSIS — R399 Unspecified symptoms and signs involving the genitourinary system: Secondary | ICD-10-CM

## 2020-01-06 MED ORDER — NITROFURANTOIN MONOHYD MACRO 100 MG PO CAPS: 100.0000 mg | ORAL_CAPSULE | Freq: Two times a day (BID) | ORAL | 0 refills | Status: AC

## 2020-01-06 NOTE — Progress Notes (Signed)

## 2021-05-22 ENCOUNTER — Telehealth: Payer: Self-pay | Admitting: Emergency Medicine

## 2021-05-22 DIAGNOSIS — J069 Acute upper respiratory infection, unspecified: Secondary | ICD-10-CM

## 2021-05-22 MED ORDER — BENZONATATE 100 MG PO CAPS: 100.0000 mg | ORAL_CAPSULE | Freq: Two times a day (BID) | ORAL | 0 refills | Status: AC | PRN

## 2021-05-22 MED ORDER — AZELASTINE HCL 0.1 % NA SOLN: 1.0000 | Freq: Two times a day (BID) | NASAL | 12 refills | Status: AC

## 2021-05-22 NOTE — Progress Notes (Signed)
I have spent 5 minutes in review of e-visit questionnaire, review and updating patient chart, medical decision making and response to patient.   Ania Levay, PA-C    

## 2021-05-22 NOTE — Progress Notes (Signed)

## 2022-03-16 ENCOUNTER — Encounter (INDEPENDENT_AMBULATORY_CARE_PROVIDER_SITE_OTHER): Payer: Self-pay

## 2022-04-18 ENCOUNTER — Telehealth: Payer: Self-pay | Admitting: Physician Assistant

## 2022-04-18 DIAGNOSIS — J069 Acute upper respiratory infection, unspecified: Secondary | ICD-10-CM

## 2022-04-18 MED ORDER — BENZONATATE 100 MG PO CAPS: 100.0000 mg | ORAL_CAPSULE | Freq: Three times a day (TID) | ORAL | 0 refills | Status: AC

## 2022-04-18 NOTE — Progress Notes (Signed)

## 2022-04-20 ENCOUNTER — Telehealth: Payer: Self-pay | Admitting: Physician Assistant

## 2022-04-20 DIAGNOSIS — J019 Acute sinusitis, unspecified: Secondary | ICD-10-CM

## 2022-04-20 DIAGNOSIS — B9689 Other specified bacterial agents as the cause of diseases classified elsewhere: Secondary | ICD-10-CM

## 2022-04-20 MED ORDER — AMOXICILLIN-POT CLAVULANATE 875-125 MG PO TABS: 1.0000 | ORAL_TABLET | Freq: Two times a day (BID) | ORAL | 0 refills | Status: DC

## 2022-04-20 NOTE — Progress Notes (Signed)

## 2023-01-03 ENCOUNTER — Telehealth: Payer: Self-pay | Admitting: Physician Assistant

## 2023-01-03 DIAGNOSIS — B9789 Other viral agents as the cause of diseases classified elsewhere: Secondary | ICD-10-CM

## 2023-01-03 DIAGNOSIS — J019 Acute sinusitis, unspecified: Secondary | ICD-10-CM

## 2023-01-03 MED ORDER — PREDNISONE 10 MG (21) PO TBPK
ORAL_TABLET | ORAL | 0 refills | Status: AC
Start: 2023-01-03 — End: ?

## 2023-01-03 NOTE — Progress Notes (Signed)
I have spent 5 minutes in review of e-visit questionnaire, review and updating patient chart, medical decision making and response to patient.   Ricquel Foulk Cody Deivi Huckins, PA-C    

## 2023-01-03 NOTE — Progress Notes (Signed)
E-Visit for Sinus Problems  We are sorry that you are not feeling well.  Here is how we plan to help!  Based on what you have shared with me it looks like you have sinusitis.  Sinusitis is inflammation and infection in the sinus cavities of the head.  Based on your presentation I believe you most likely have Acute Viral Sinusitis.This is an infection most likely caused by a virus. There is not specific treatment for viral sinusitis other than to help you with the symptoms until the infection runs its course.  You may use an oral decongestant such as Mucinex D or if you have glaucoma or high blood pressure use plain Mucinex. Saline nasal spray help and can safely be used as often as needed for congestion, I have prescribed: prednisone tablet pack to take as directed.   Some authorities believe that zinc sprays or the use of Echinacea may shorten the course of your symptoms.  Sinus infections are not as easily transmitted as other respiratory infection, however we still recommend that you avoid close contact with loved ones, especially the very young and elderly.  Remember to wash your hands thoroughly throughout the day as this is the number one way to prevent the spread of infection!  Home Care: Only take medications as instructed by your medical team. Do not take these medications with alcohol. A steam or ultrasonic humidifier can help congestion.  You can place a towel over your head and breathe in the steam from hot water coming from a faucet. Avoid close contacts especially the very young and the elderly. Cover your mouth when you cough or sneeze. Always remember to wash your hands.  Get Help Right Away If: You develop worsening fever or sinus pain. You develop a severe head ache or visual changes. Your symptoms persist after you have completed your treatment plan.  Make sure you Understand these instructions. Will watch your condition. Will get help right away if you are not doing well  or get worse.   Thank you for choosing an e-visit.  Your e-visit answers were reviewed by a board certified advanced clinical practitioner to complete your personal care plan. Depending upon the condition, your plan could have included both over the counter or prescription medications.  Please review your pharmacy choice. Make sure the pharmacy is open so you can pick up prescription now. If there is a problem, you may contact your provider through [employer] and have the prescription routed to another pharmacy.  Your safety is important to [name]. If you have drug allergies check your prescription carefully.   For the next 24 hours you can use MyChart to ask questions about today's visit, request a non-urgent call back, or ask for a work or school excuse. You will get an email in the next two days asking about your experience. I hope that your e-visit has been valuable and will speed your recovery.

## 2023-01-29 ENCOUNTER — Telehealth: Payer: Self-pay | Admitting: Physician Assistant

## 2023-01-29 DIAGNOSIS — R198 Other specified symptoms and signs involving the digestive system and abdomen: Secondary | ICD-10-CM

## 2023-01-30 NOTE — Progress Notes (Signed)
   Thank you for the details you included in the comment boxes. Those details are very helpful in determining the best course of treatment for you and help [name] to provide the best care. Because of not being able to attach a picture, we recommend that you convert this visit to a video visit in order for the provider to better assess what is going on.  The provider will be able to give you a more accurate diagnosis and treatment plan if we can more freely discuss your symptoms and with the addition of a virtual examination.   If you convert to a video visit, we will bill your insurance (similar to an office visit) and you will not be charged for this e-Visit. You will be able to stay at home and speak with the first available [facility] advanced practice provider. The link to do a video visit is in the drop down Menu tab of your Welcome screen in MyChart.  You will need to schedule the video visit. You can do this through one of two ways:  1) Go into your MyChart App and select the "Menu" button, then select the "Virtual Urgent Care Visit" then proceed scheduling -OR- 2) Go to [email or web address] and select "Get Started" under the Virtual Urgent Care option, select "View all options", then select the "Schedule on your Time" and proceed with scheduling.  I have spent 5 minutes in review of e-visit questionnaire, review and updating patient chart, medical decision making and response to patient.   Margaretann Loveless, PA-C

## 2023-04-06 ENCOUNTER — Telehealth: Payer: 59 | Admitting: Physician Assistant

## 2023-04-06 DIAGNOSIS — J019 Acute sinusitis, unspecified: Secondary | ICD-10-CM | POA: Diagnosis not present

## 2023-04-06 DIAGNOSIS — B9689 Other specified bacterial agents as the cause of diseases classified elsewhere: Secondary | ICD-10-CM

## 2023-04-06 MED ORDER — AMOXICILLIN-POT CLAVULANATE 875-125 MG PO TABS: 1.0000 | ORAL_TABLET | Freq: Two times a day (BID) | ORAL | 0 refills | Status: DC

## 2023-04-06 NOTE — Progress Notes (Signed)
I have spent 5 minutes in review of e-visit questionnaire, review and updating patient chart, medical decision making and response to patient.   William Cody Martin, PA-C    

## 2023-04-06 NOTE — Progress Notes (Signed)

## 2024-09-03 ENCOUNTER — Telehealth: Admitting: Emergency Medicine

## 2024-09-03 DIAGNOSIS — B9689 Other specified bacterial agents as the cause of diseases classified elsewhere: Secondary | ICD-10-CM

## 2024-09-03 DIAGNOSIS — J019 Acute sinusitis, unspecified: Secondary | ICD-10-CM | POA: Diagnosis not present

## 2024-09-03 MED ORDER — AMOXICILLIN-POT CLAVULANATE 875-125 MG PO TABS: 1.0000 | ORAL_TABLET | Freq: Two times a day (BID) | ORAL | 0 refills | Status: AC

## 2024-09-03 NOTE — Progress Notes (Signed)
 E-Visit for Sinus Problems  We are sorry that you are not feeling well.  Here is how we plan to help!  Based on what you have shared with me it looks like you have sinusitis.  Sinusitis is inflammation and infection in the sinus cavities of the head.  Based on your presentation I believe you most likely have Acute Bacterial Sinusitis.  This is an infection caused by bacteria and is treated with antibiotics. I have prescribed Augmentin  875mg /125mg  one tablet twice daily with food, for 7 days.   You may use an oral decongestant such as Mucinex D or if you have glaucoma or high blood pressure use plain Mucinex. Saline nasal spray help and can safely be used as often as needed for congestion. Try using saline irrigation, such as with a neti pot, several times a day while you are sick. Many neti pots come with salt packets premeasured to use to make saline. If you use your own salt, make sure it is kosher salt or sea salt (don't use table salt as it has iodine in it and you don't need that in your nose). Use distilled water to make saline. If you mix your own saline using your own salt, the recipe is 1/4 teaspoon salt in 1 cup warm water. Using saline irrigation can help prevent and treat sinus infections.   If you develop worsening sinus pain, fever or notice severe headache and vision changes, or if symptoms are not better after completion of antibiotic, please schedule an appointment with a health care provider.    Sinus infections are not as easily transmitted as other respiratory infection, however we still recommend that you avoid close contact with loved ones, especially the very young and elderly.  Remember to wash your hands thoroughly throughout the day as this is the number one way to prevent the spread of infection!  Home Care: Only take medications as instructed by your medical team. Complete the entire course of an antibiotic. Do not take these medications with alcohol. A steam or  ultrasonic humidifier can help congestion.  You can place a towel over your head and breathe in the steam from hot water coming from a faucet. Avoid close contacts especially the very young and the elderly. Cover your mouth when you cough or sneeze. Always remember to wash your hands.  Get Help Right Away If: You develop worsening fever or sinus pain. You develop a severe head ache or visual changes. Your symptoms persist after you have completed your treatment plan.  Make sure you Understand these instructions. Will watch your condition. Will get help right away if you are not doing well or get worse.  Your e-visit answers were reviewed by a board certified advanced clinical practitioner to complete your personal care plan.  Depending on the condition, your plan could have included both over the counter or prescription medications.  If there is a problem please reply  once you have received a response from your provider.  Your safety is important to us .  If you have drug allergies check your prescription carefully.    You can use MyChart to ask questions about today's visit, request a non-urgent call back, or ask for a work or school excuse for 24 hours related to this e-Visit. If it has been greater than 24 hours you will need to follow up with your provider, or enter a new e-Visit to address those concerns.  You will get an e-mail in the next two days asking  about your experience.  I hope that your e-visit has been valuable and will speed your recovery. Thank you for using e-visits.  I have spent 5 minutes in review of e-visit questionnaire, review and updating patient chart, medical decision making and response to patient.   Jon Belt, PhD, FNP-BC
